# Patient Record
Sex: Female | Born: 1945
Health system: Southern US, Community
[De-identification: ages and names within clinical notes are randomized; demographics above are authoritative.]

## PROBLEM LIST (undated history)

## (undated) DIAGNOSIS — I10 Essential (primary) hypertension: Secondary | ICD-10-CM

## (undated) DIAGNOSIS — I2510 Atherosclerotic heart disease of native coronary artery without angina pectoris: Secondary | ICD-10-CM

## (undated) DIAGNOSIS — E7800 Pure hypercholesterolemia, unspecified: Secondary | ICD-10-CM

## (undated) DIAGNOSIS — F419 Anxiety disorder, unspecified: Secondary | ICD-10-CM

## (undated) DIAGNOSIS — F32A Depression, unspecified: Secondary | ICD-10-CM

## (undated) DIAGNOSIS — T148XXA Other injury of unspecified body region, initial encounter: Secondary | ICD-10-CM

## (undated) DIAGNOSIS — F329 Major depressive disorder, single episode, unspecified: Secondary | ICD-10-CM

## (undated) DIAGNOSIS — C801 Malignant (primary) neoplasm, unspecified: Secondary | ICD-10-CM

## (undated) DIAGNOSIS — I219 Acute myocardial infarction, unspecified: Secondary | ICD-10-CM

## (undated) HISTORY — PX: COLONOSCOPY: SHX174

## (undated) HISTORY — PX: CORONARY ANGIOPLASTY: SHX604

## (undated) HISTORY — PX: EYE SURGERY: SHX253

## (undated) HISTORY — PX: FINGER SURGERY: SHX640

---

## 2004-05-04 ENCOUNTER — Other Ambulatory Visit: Payer: Self-pay

## 2004-05-04 ENCOUNTER — Inpatient Hospital Stay: Payer: Self-pay | Admitting: Cardiology

## 2004-05-05 ENCOUNTER — Other Ambulatory Visit: Payer: Self-pay

## 2004-05-23 ENCOUNTER — Encounter: Payer: Self-pay | Admitting: Cardiology

## 2004-06-08 ENCOUNTER — Encounter: Payer: Self-pay | Admitting: Cardiology

## 2004-07-09 ENCOUNTER — Encounter: Payer: Self-pay | Admitting: Cardiology

## 2004-08-09 ENCOUNTER — Encounter: Payer: Self-pay | Admitting: Cardiology

## 2004-12-21 ENCOUNTER — Emergency Department: Payer: Self-pay | Admitting: Emergency Medicine

## 2005-01-02 ENCOUNTER — Emergency Department: Payer: Self-pay | Admitting: Emergency Medicine

## 2005-05-07 ENCOUNTER — Ambulatory Visit: Payer: Self-pay | Admitting: Unknown Physician Specialty

## 2005-11-26 ENCOUNTER — Ambulatory Visit: Payer: Self-pay | Admitting: Unknown Physician Specialty

## 2006-12-24 ENCOUNTER — Ambulatory Visit: Payer: Self-pay | Admitting: Family Medicine

## 2007-04-03 ENCOUNTER — Ambulatory Visit: Payer: Self-pay | Admitting: Unknown Physician Specialty

## 2008-11-19 ENCOUNTER — Ambulatory Visit: Payer: Self-pay | Admitting: Unknown Physician Specialty

## 2010-01-12 ENCOUNTER — Ambulatory Visit: Payer: Self-pay | Admitting: Unknown Physician Specialty

## 2011-03-15 ENCOUNTER — Ambulatory Visit: Payer: Self-pay | Admitting: Unknown Physician Specialty

## 2012-09-15 ENCOUNTER — Ambulatory Visit: Payer: Self-pay | Admitting: Unknown Physician Specialty

## 2014-10-14 DIAGNOSIS — L720 Epidermal cyst: Secondary | ICD-10-CM | POA: Diagnosis not present

## 2014-10-14 DIAGNOSIS — D481 Neoplasm of uncertain behavior of connective and other soft tissue: Secondary | ICD-10-CM | POA: Diagnosis not present

## 2014-10-14 DIAGNOSIS — D2110 Benign neoplasm of connective and other soft tissue of unspecified upper limb, including shoulder: Secondary | ICD-10-CM | POA: Diagnosis not present

## 2014-10-14 DIAGNOSIS — D2111 Benign neoplasm of connective and other soft tissue of right upper limb, including shoulder: Secondary | ICD-10-CM | POA: Diagnosis not present

## 2014-11-09 DIAGNOSIS — H524 Presbyopia: Secondary | ICD-10-CM | POA: Diagnosis not present

## 2014-11-09 DIAGNOSIS — H5210 Myopia, unspecified eye: Secondary | ICD-10-CM | POA: Diagnosis not present

## 2014-11-19 DIAGNOSIS — H2513 Age-related nuclear cataract, bilateral: Secondary | ICD-10-CM | POA: Diagnosis not present

## 2014-12-09 DIAGNOSIS — L820 Inflamed seborrheic keratosis: Secondary | ICD-10-CM | POA: Diagnosis not present

## 2014-12-09 DIAGNOSIS — D485 Neoplasm of uncertain behavior of skin: Secondary | ICD-10-CM | POA: Diagnosis not present

## 2014-12-09 DIAGNOSIS — L821 Other seborrheic keratosis: Secondary | ICD-10-CM | POA: Diagnosis not present

## 2014-12-09 DIAGNOSIS — C44629 Squamous cell carcinoma of skin of left upper limb, including shoulder: Secondary | ICD-10-CM | POA: Diagnosis not present

## 2014-12-09 DIAGNOSIS — L578 Other skin changes due to chronic exposure to nonionizing radiation: Secondary | ICD-10-CM | POA: Diagnosis not present

## 2014-12-13 ENCOUNTER — Encounter
Admission: RE | Admit: 2014-12-13 | Discharge: 2014-12-13 | Disposition: A | Discharge status: Home / Self Care | Payer: Commercial Managed Care - HMO | Source: Ambulatory Visit | Attending: Ophthalmology | Admitting: Ophthalmology

## 2014-12-13 DIAGNOSIS — I10 Essential (primary) hypertension: Secondary | ICD-10-CM | POA: Diagnosis not present

## 2014-12-13 DIAGNOSIS — H2513 Age-related nuclear cataract, bilateral: Secondary | ICD-10-CM | POA: Diagnosis not present

## 2014-12-13 DIAGNOSIS — Z01812 Encounter for preprocedural laboratory examination: Secondary | ICD-10-CM | POA: Diagnosis not present

## 2014-12-13 DIAGNOSIS — Z01810 Encounter for preprocedural cardiovascular examination: Secondary | ICD-10-CM | POA: Diagnosis not present

## 2014-12-13 DIAGNOSIS — Z79899 Other long term (current) drug therapy: Secondary | ICD-10-CM | POA: Insufficient documentation

## 2014-12-13 LAB — POTASSIUM: POTASSIUM: 3.9 mmol/L (ref 3.5–5.1)

## 2014-12-14 ENCOUNTER — Encounter: Payer: Self-pay | Admitting: *Deleted

## 2014-12-14 DIAGNOSIS — Z85828 Personal history of other malignant neoplasm of skin: Secondary | ICD-10-CM | POA: Diagnosis not present

## 2014-12-14 DIAGNOSIS — Z87891 Personal history of nicotine dependence: Secondary | ICD-10-CM | POA: Diagnosis not present

## 2014-12-14 DIAGNOSIS — Z880 Allergy status to penicillin: Secondary | ICD-10-CM | POA: Diagnosis not present

## 2014-12-14 DIAGNOSIS — I252 Old myocardial infarction: Secondary | ICD-10-CM | POA: Diagnosis not present

## 2014-12-14 DIAGNOSIS — H2511 Age-related nuclear cataract, right eye: Secondary | ICD-10-CM | POA: Diagnosis not present

## 2014-12-14 DIAGNOSIS — I10 Essential (primary) hypertension: Secondary | ICD-10-CM | POA: Diagnosis not present

## 2014-12-14 DIAGNOSIS — F329 Major depressive disorder, single episode, unspecified: Secondary | ICD-10-CM | POA: Diagnosis not present

## 2014-12-14 DIAGNOSIS — Z955 Presence of coronary angioplasty implant and graft: Secondary | ICD-10-CM | POA: Diagnosis not present

## 2014-12-20 ENCOUNTER — Encounter
Admission: RE | Disposition: A | Discharge status: Home / Self Care | Payer: Self-pay | Source: Ambulatory Visit | Attending: Ophthalmology

## 2014-12-20 ENCOUNTER — Ambulatory Visit: Payer: Commercial Managed Care - HMO | Admitting: *Deleted

## 2014-12-20 ENCOUNTER — Ambulatory Visit
Admission: RE | Admit: 2014-12-20 | Discharge: 2014-12-20 | Disposition: A | Discharge status: Home / Self Care | Payer: Commercial Managed Care - HMO | Source: Ambulatory Visit | Attending: Ophthalmology | Admitting: Ophthalmology

## 2014-12-20 ENCOUNTER — Encounter: Payer: Self-pay | Admitting: *Deleted

## 2014-12-20 DIAGNOSIS — I252 Old myocardial infarction: Secondary | ICD-10-CM | POA: Diagnosis not present

## 2014-12-20 DIAGNOSIS — Z955 Presence of coronary angioplasty implant and graft: Secondary | ICD-10-CM | POA: Insufficient documentation

## 2014-12-20 DIAGNOSIS — H2511 Age-related nuclear cataract, right eye: Secondary | ICD-10-CM | POA: Insufficient documentation

## 2014-12-20 DIAGNOSIS — F329 Major depressive disorder, single episode, unspecified: Secondary | ICD-10-CM | POA: Diagnosis not present

## 2014-12-20 DIAGNOSIS — Z87891 Personal history of nicotine dependence: Secondary | ICD-10-CM | POA: Insufficient documentation

## 2014-12-20 DIAGNOSIS — I10 Essential (primary) hypertension: Secondary | ICD-10-CM | POA: Insufficient documentation

## 2014-12-20 DIAGNOSIS — Z85828 Personal history of other malignant neoplasm of skin: Secondary | ICD-10-CM | POA: Insufficient documentation

## 2014-12-20 DIAGNOSIS — Z880 Allergy status to penicillin: Secondary | ICD-10-CM | POA: Insufficient documentation

## 2014-12-20 DIAGNOSIS — H2513 Age-related nuclear cataract, bilateral: Secondary | ICD-10-CM | POA: Diagnosis not present

## 2014-12-20 DIAGNOSIS — I2510 Atherosclerotic heart disease of native coronary artery without angina pectoris: Secondary | ICD-10-CM | POA: Diagnosis not present

## 2014-12-20 DIAGNOSIS — I213 ST elevation (STEMI) myocardial infarction of unspecified site: Secondary | ICD-10-CM | POA: Diagnosis not present

## 2014-12-20 HISTORY — DX: Malignant (primary) neoplasm, unspecified: C80.1

## 2014-12-20 HISTORY — DX: Essential (primary) hypertension: I10

## 2014-12-20 HISTORY — DX: Depression, unspecified: F32.A

## 2014-12-20 HISTORY — PX: CATARACT EXTRACTION W/PHACO: SHX586

## 2014-12-20 HISTORY — DX: Major depressive disorder, single episode, unspecified: F32.9

## 2014-12-20 HISTORY — DX: Acute myocardial infarction, unspecified: I21.9

## 2014-12-20 HISTORY — DX: Atherosclerotic heart disease of native coronary artery without angina pectoris: I25.10

## 2014-12-20 SURGERY — PHACOEMULSIFICATION, CATARACT, WITH IOL INSERTION
Anesthesia: Monitor Anesthesia Care | Site: Eye | Laterality: Right | Wound class: Clean

## 2014-12-20 MED ORDER — SODIUM CHLORIDE 0.9 % IV SOLN
INTRAVENOUS | Status: DC
  Administered 2014-12-20: 10:00:00 via INTRAVENOUS

## 2014-12-20 MED ORDER — NA CHONDROIT SULF-NA HYALURON 40-17 MG/ML IO SOLN
INTRAOCULAR | Status: DC | PRN
Start: 2014-12-20 — End: 2014-12-20
  Administered 2014-12-20: 1 mL via INTRAOCULAR

## 2014-12-20 MED ORDER — PHENYLEPHRINE HCL 10 % OP SOLN
1.0000 [drp] | OPHTHALMIC | Status: AC | PRN
  Administered 2014-12-20 (×4): 1 [drp] via OPHTHALMIC

## 2014-12-20 MED ORDER — CYCLOPENTOLATE HCL 2 % OP SOLN
OPHTHALMIC | Status: AC
  Filled 2014-12-20: qty 2

## 2014-12-20 MED ORDER — NA CHONDROIT SULF-NA HYALURON 40-17 MG/ML IO SOLN
INTRAOCULAR | Status: AC
  Filled 2014-12-20: qty 1

## 2014-12-20 MED ORDER — LIDOCAINE HCL (PF) 1 % IJ SOLN
INTRAOCULAR | Status: DC | PRN
  Administered 2014-12-20: 11:00:00

## 2014-12-20 MED ORDER — MOXIFLOXACIN HCL 0.5 % OP SOLN
OPHTHALMIC | Status: AC
  Administered 2014-12-20: 1 [drp] via OPHTHALMIC
  Filled 2014-12-20: qty 3

## 2014-12-20 MED ORDER — EPINEPHRINE HCL 1 MG/ML IJ SOLN
INTRAMUSCULAR | Status: AC
  Filled 2014-12-20: qty 2

## 2014-12-20 MED ORDER — CYCLOPENTOLATE HCL 2 % OP SOLN
1.0000 [drp] | OPHTHALMIC | Status: AC | PRN
  Administered 2014-12-20: 10:00:00 via OPHTHALMIC
  Administered 2014-12-20 (×3): 1 [drp] via OPHTHALMIC

## 2014-12-20 MED ORDER — LIDOCAINE HCL (PF) 4 % IJ SOLN
INTRAMUSCULAR | Status: AC
  Filled 2014-12-20: qty 10

## 2014-12-20 MED ORDER — TETRACAINE HCL 0.5 % OP SOLN
OPHTHALMIC | Status: AC
  Filled 2014-12-20: qty 2

## 2014-12-20 MED ORDER — BUPIVACAINE HCL (PF) 0.75 % IJ SOLN
INTRAMUSCULAR | Status: AC
  Filled 2014-12-20: qty 10

## 2014-12-20 MED ORDER — ALFENTANIL 500 MCG/ML IJ INJ
INJECTION | INTRAMUSCULAR | Status: DC | PRN
  Administered 2014-12-20: 500 ug via INTRAVENOUS

## 2014-12-20 MED ORDER — PHENYLEPHRINE HCL 10 % OP SOLN
OPHTHALMIC | Status: AC
  Administered 2014-12-20: 1 [drp] via OPHTHALMIC
  Filled 2014-12-20: qty 5

## 2014-12-20 MED ORDER — MOXIFLOXACIN HCL 0.5 % OP SOLN - NO CHARGE
OPHTHALMIC | Status: DC | PRN
  Administered 2014-12-20: 1 [drp]

## 2014-12-20 MED ORDER — CEFUROXIME OPHTHALMIC INJECTION 1 MG/0.1 ML
INJECTION | OPHTHALMIC | Status: AC
  Filled 2014-12-20: qty 0.1

## 2014-12-20 MED ORDER — MIDAZOLAM HCL 2 MG/2ML IJ SOLN
INTRAMUSCULAR | Status: DC | PRN
  Administered 2014-12-20: .5 mg via INTRAVENOUS

## 2014-12-20 MED ORDER — BSS IO SOLN
INTRAOCULAR | Status: DC | PRN
  Administered 2014-12-20: 200 mL

## 2014-12-20 MED ORDER — MOXIFLOXACIN HCL 0.5 % OP SOLN
1.0000 [drp] | OPHTHALMIC | Status: AC | PRN
  Administered 2014-12-20 (×3): 1 [drp] via OPHTHALMIC

## 2014-12-20 MED ORDER — HYALURONIDASE HUMAN 150 UNIT/ML IJ SOLN
INTRAMUSCULAR | Status: AC
  Filled 2014-12-20: qty 1

## 2014-12-20 MED ORDER — LIDOCAINE HCL (PF) 4 % IJ SOLN
INTRAMUSCULAR | Status: DC | PRN
  Administered 2014-12-20: 5 mL

## 2014-12-20 MED ORDER — FENTANYL CITRATE (PF) 100 MCG/2ML IJ SOLN
INTRAMUSCULAR | Status: DC | PRN
  Administered 2014-12-20: 50 ug via INTRAVENOUS

## 2014-12-20 MED ORDER — CARBACHOL 0.01 % IO SOLN
INTRAOCULAR | Status: DC | PRN
  Administered 2014-12-20: 0.5 mL via INTRAOCULAR

## 2014-12-20 MED ORDER — TETRACAINE HCL 0.5 % OP SOLN
OPHTHALMIC | Status: DC | PRN
  Administered 2014-12-20: 1 [drp]

## 2014-12-20 SURGICAL SUPPLY — 27 items
ACTIVE FMS ×3 IMPLANT
CORD BIP STRL DISP 12FT (MISCELLANEOUS) ×3 IMPLANT
CUP MEDICINE 2OZ PLAST GRAD ST (MISCELLANEOUS) ×3 IMPLANT
DRAPE XRAY CASSETTE 23X24 (DRAPES) ×3 IMPLANT
ERASER HMR WETFIELD 18G (MISCELLANEOUS) ×3 IMPLANT
GLOVE BIO SURGEON STRL SZ8 (GLOVE) ×3 IMPLANT
GLOVE SURG LX 6.5 MICRO (GLOVE) ×2
GLOVE SURG LX 8.0 MICRO (GLOVE) ×2
GLOVE SURG LX STRL 6.5 MICRO (GLOVE) ×1 IMPLANT
GLOVE SURG LX STRL 8.0 MICRO (GLOVE) ×1 IMPLANT
GOWN STRL REUS W/ TWL LRG LVL3 (GOWN DISPOSABLE) ×1 IMPLANT
GOWN STRL REUS W/ TWL XL LVL3 (GOWN DISPOSABLE) ×1 IMPLANT
GOWN STRL REUS W/TWL LRG LVL3 (GOWN DISPOSABLE) ×2
GOWN STRL REUS W/TWL XL LVL3 (GOWN DISPOSABLE) ×2
LENS IOL ACRYSERT 20.5 (Intraocular Lens) ×3 IMPLANT
PACK CATARACT (MISCELLANEOUS) ×3 IMPLANT
PACK CATARACT DINGLEDEIN LX (MISCELLANEOUS) ×3 IMPLANT
PACK EYE AFTER SURG (MISCELLANEOUS) ×3 IMPLANT
SHLD EYE VISITEC  UNIV (MISCELLANEOUS) ×3 IMPLANT
SOL PREP PVP 2OZ (MISCELLANEOUS) ×3
SOLUTION PREP PVP 2OZ (MISCELLANEOUS) ×1 IMPLANT
SUT SILK 5-0 (SUTURE) ×3 IMPLANT
SYR 5ML LL (SYRINGE) ×3 IMPLANT
SYR TB 1ML 27GX1/2 LL (SYRINGE) ×6 IMPLANT
WATER STERILE IRR 1000ML POUR (IV SOLUTION) ×3 IMPLANT
WIPE NON LINTING 3.25X3.25 (MISCELLANEOUS) ×3 IMPLANT
acrysert 20.5 ×3 IMPLANT

## 2014-12-20 NOTE — H&P (Signed)
  History and physical was faxed and scanned in.   

## 2014-12-20 NOTE — Anesthesia Preprocedure Evaluation (Signed)
Anesthesia Evaluation  Patient identified by MRN, date of birth, ID band Patient awake    Reviewed: Allergy & Precautions, NPO status , Patient's Chart, lab work & pertinent test results  Airway Mallampati: I  TM Distance: >3 FB Neck ROM: Limited    Dental  (+) Teeth Intact   Pulmonary former smoker,    Pulmonary exam normal       Cardiovascular Exercise Tolerance: Good hypertension, Pt. on medications and Pt. on home beta blockers Rate:Bradycardia  Stents in 2006 and has done weel since.   Neuro/Psych    GI/Hepatic   Endo/Other    Renal/GU      Musculoskeletal   Abdominal Normal abdominal exam  (+)   Peds  Hematology   Anesthesia Other Findings   Reproductive/Obstetrics                             Anesthesia Physical Anesthesia Plan  ASA: III  Anesthesia Plan: MAC   Post-op Pain Management:    Induction:   Airway Management Planned: Nasal Cannula  Additional Equipment:   Intra-op Plan:   Post-operative Plan:   Informed Consent: I have reviewed the patients History and Physical, chart, labs and discussed the procedure including the risks, benefits and alternatives for the proposed anesthesia with the patient or authorized representative who has indicated his/her understanding and acceptance.     Plan Discussed with: CRNA  Anesthesia Plan Comments:         Anesthesia Quick Evaluation

## 2014-12-20 NOTE — Discharge Instructions (Addendum)
See handout Eye Surgery Discharge Instructions  Expect mild scratchy sensation or mild soreness. DO NOT RUB YOUR EYE!  The day of surgery:  Minimal physical activity, but bed rest is not required  No reading, computer work, or close hand work  No bending, lifting, or straining.  May watch TV  For 24 hours:  No driving, legal decisions, or alcoholic beverages  Safety precautions  Eat anything you prefer: It is better to start with liquids, then soup then solid foods.  _____ Eye patch should be worn until postoperative exam tomorrow.  ____ Solar shield eyeglasses should be worn for comfort in the sunlight/patch while sleeping  Resume all regular medications including aspirin or Coumadin if these were discontinued prior to surgery. You may shower, bathe, shave, or wash your hair. Tylenol may be taken for mild discomfort.  Call your doctor if you experience significant pain, nausea, or vomiting, fever > 101 or other signs of infection. [phone] or [phone] Specific instructions:  Follow-up Information    Follow up with Estill Cotta, MD On 12/21/2014.   Specialty:  Ophthalmology   Why:  945am   Contact information:   [place] [address] [phone]     AMBULATORY SURGERY  DISCHARGE INSTRUCTIONS   1) The drugs that you were given will stay in your system until tomorrow so for the next 24 hours you should not:  A) Drive an automobile B) Make any legal decisions C) Drink any alcoholic beverage   2) You may resume regular meals tomorrow.  Today it is better to start with liquids and gradually work up to solid foods.  You may eat anything you prefer, but it is better to start with liquids, then soup and crackers, and gradually work up to solid foods.   3) Please notify your doctor immediately if you have any unusual bleeding, trouble breathing, redness and pain at the surgery site, drainage, fever, or pain not relieved by  medication.         4)

## 2014-12-20 NOTE — Transfer of Care (Signed)
Immediate Anesthesia Transfer of Care Note  Patient: Makayla Burke  Procedure(s) Performed: Procedure(s) with comments: CATARACT EXTRACTION PHACO AND INTRAOCULAR LENS PLACEMENT ([facility]) (Right) - [place] 00:50 AP% 22.2 CDE 21.36  Patient Location: PACU  Anesthesia Type:MAC  Level of Consciousness: awake, alert  and oriented  Airway & Oxygen Therapy: Patient Spontanous Breathing  Post-op Assessment: Report given to RN and Post -op Vital signs reviewed and stable  Post vital signs: Reviewed and stable  Last Vitals:  Filed Vitals:   12/20/14 1151  BP: 152/83  Pulse:   Temp: 35.8 C  Resp: 16    Complications: No apparent anesthesia complications

## 2014-12-20 NOTE — Op Note (Signed)
Date of Surgery: 12/20/2014 Date of Dictation: 12/20/2014 11:48 AM Pre-operative Diagnosis:  Nuclear Sclerotic Cataract right Eye Post-operative Diagnosis: same Procedure performed: Extra-capsular Cataract Extraction (ECCE) with placement of a posterior chamber intraocular lens (IOL) right Eye IOL:  Implant Name Type Inv. Item Serial No. Manufacturer Lot No. LRB No. Used  acrysert 20.5     [id number]     Right 1   Anesthesia: 2% Lidocaine and 4% Marcaine in a 50/50 mixture with 10 unites/ml of Hylenex given as a peribulbar Anesthesiologist: Anesthesiologist: Elyse Hsu, MD CRNA: Bernardo Heater, CRNA Complications: none Estimated Blood Loss: less than 1 ml  Description of procedure:  The patient was given anesthesia and sedation via intravenous access. The patient was then prepped and draped in the usual fashion. A 25-gauge needle was bent for initiating the capsulorhexis. A 5-0 silk suture was placed through the conjunctiva superior and inferiorly to serve as bridle sutures. Hemostasis was obtained at the superior limbus using an eraser cautery. A partial thickness groove was made at the anterior surgical limbus with a 64 Beaver blade and this was dissected anteriorly with an Avaya. The anterior chamber was entered at 10 o'clock with a 1.0 mm paracentesis knife and through the lamellar dissection with a 2.6 mm Alcon keratome. DiscoVisc was injected to replace the aqueous and a continuous tear curvilinear capsulorhexis was performed using a bent 25-gauge needle.  Balance salt on a syringe was used to perform hydro-dissection and phacoemulsification was carried out using a divide and conquer technique. Procedure(s) with comments: CATARACT EXTRACTION PHACO AND INTRAOCULAR LENS PLACEMENT ([facility]) (Right) - [place] 00:50 AP% 22.2 CDE 21.36. Irrigation/aspiration was used to remove the residual cortex and the capsular bag was inflated with DiscoVisc. The intraocular lens was inserted into the  capsular bag using a pre-loaded Acrysert Delivery System. Irrigation/aspiration was used to remove the residual DiscoVisc. The wound was inflated with balanced salt and checked for leaks. None were found. Miostat was injected via the paracentesis track and 0.1 ml of Vigamox containing 1 mg of drug  was injected via the paracentesis track. The wound was checked for leaks again and none were found.   The bridal sutures were removed and two drops of Vigamox were placed on the eye. An eye shield was placed to protect the eye and the patient was discharged to the recovery area in good condition.   Robecca Fulgham MD

## 2014-12-20 NOTE — Interval H&P Note (Signed)
History and Physical Interval Note:  12/20/2014 11:03 AM  [name]  has presented today for surgery, with the diagnosis of CATARACT  The various methods of treatment have been discussed with the patient and family. After consideration of risks, benefits and other options for treatment, the patient has consented to  Procedure(s): CATARACT EXTRACTION PHACO AND INTRAOCULAR LENS PLACEMENT ([place]) (Right) as a surgical intervention .  The patient's history has been reviewed, patient examined, no change in status, stable for surgery.  I have reviewed the patient's chart and labs.  Questions were answered to the patient's satisfaction.     Makayla Burke

## 2014-12-20 NOTE — Anesthesia Postprocedure Evaluation (Signed)
  Anesthesia Post-op Note  Patient: Makayla Burke  Procedure(s) Performed: Procedure(s) with comments: CATARACT EXTRACTION PHACO AND INTRAOCULAR LENS PLACEMENT ([facility]) (Right) - [place] 00:50 AP% 22.2 CDE 21.36  Anesthesia type:MAC  Patient location: PACU  Post pain: Pain level controlled  Post assessment: Post-op Vital signs reviewed, Patient's Cardiovascular Status Stable, Respiratory Function Stable, Patent Airway and No signs of Nausea or vomiting  Post vital signs: Reviewed and stable  Last Vitals:  Filed Vitals:   12/20/14 1151  BP: 152/83  Pulse:   Temp: 35.8 C  Resp: 16    Level of consciousness: awake, alert  and patient cooperative  Complications: No apparent anesthesia complications

## 2015-03-23 DIAGNOSIS — H2512 Age-related nuclear cataract, left eye: Secondary | ICD-10-CM | POA: Diagnosis not present

## 2015-03-28 ENCOUNTER — Ambulatory Visit
Admission: RE | Admit: 2015-03-28 | Discharge: 2015-03-28 | Disposition: A | Discharge status: Home / Self Care | Payer: Commercial Managed Care - HMO | Source: Ambulatory Visit | Attending: Ophthalmology | Admitting: Ophthalmology

## 2015-03-28 ENCOUNTER — Ambulatory Visit: Payer: Commercial Managed Care - HMO | Admitting: Anesthesiology

## 2015-03-28 ENCOUNTER — Encounter
Admission: RE | Disposition: A | Discharge status: Home / Self Care | Payer: Self-pay | Source: Ambulatory Visit | Attending: Ophthalmology

## 2015-03-28 DIAGNOSIS — E780 Pure hypercholesterolemia: Secondary | ICD-10-CM | POA: Insufficient documentation

## 2015-03-28 DIAGNOSIS — Z87891 Personal history of nicotine dependence: Secondary | ICD-10-CM | POA: Insufficient documentation

## 2015-03-28 DIAGNOSIS — Z9889 Other specified postprocedural states: Secondary | ICD-10-CM | POA: Insufficient documentation

## 2015-03-28 DIAGNOSIS — Z79899 Other long term (current) drug therapy: Secondary | ICD-10-CM | POA: Diagnosis not present

## 2015-03-28 DIAGNOSIS — Z9849 Cataract extraction status, unspecified eye: Secondary | ICD-10-CM | POA: Insufficient documentation

## 2015-03-28 DIAGNOSIS — I252 Old myocardial infarction: Secondary | ICD-10-CM | POA: Diagnosis not present

## 2015-03-28 DIAGNOSIS — F329 Major depressive disorder, single episode, unspecified: Secondary | ICD-10-CM | POA: Diagnosis not present

## 2015-03-28 DIAGNOSIS — H2512 Age-related nuclear cataract, left eye: Secondary | ICD-10-CM | POA: Insufficient documentation

## 2015-03-28 DIAGNOSIS — I2510 Atherosclerotic heart disease of native coronary artery without angina pectoris: Secondary | ICD-10-CM | POA: Diagnosis not present

## 2015-03-28 DIAGNOSIS — Z95818 Presence of other cardiac implants and grafts: Secondary | ICD-10-CM | POA: Diagnosis not present

## 2015-03-28 DIAGNOSIS — Z880 Allergy status to penicillin: Secondary | ICD-10-CM | POA: Insufficient documentation

## 2015-03-28 DIAGNOSIS — Z85828 Personal history of other malignant neoplasm of skin: Secondary | ICD-10-CM | POA: Insufficient documentation

## 2015-03-28 DIAGNOSIS — Z7982 Long term (current) use of aspirin: Secondary | ICD-10-CM | POA: Insufficient documentation

## 2015-03-28 DIAGNOSIS — I10 Essential (primary) hypertension: Secondary | ICD-10-CM | POA: Diagnosis not present

## 2015-03-28 HISTORY — PX: CATARACT EXTRACTION W/PHACO: SHX586

## 2015-03-28 HISTORY — DX: Pure hypercholesterolemia, unspecified: E78.00

## 2015-03-28 SURGERY — PHACOEMULSIFICATION, CATARACT, WITH IOL INSERTION
Anesthesia: Monitor Anesthesia Care | Laterality: Left

## 2015-03-28 MED ORDER — LIDOCAINE HCL (PF) 4 % IJ SOLN
INTRAMUSCULAR | Status: DC | PRN
  Administered 2015-03-28: 5 mL via OPHTHALMIC

## 2015-03-28 MED ORDER — EPINEPHRINE HCL 1 MG/ML IJ SOLN
INTRAMUSCULAR | Status: AC
  Filled 2015-03-28: qty 2

## 2015-03-28 MED ORDER — NA CHONDROIT SULF-NA HYALURON 40-17 MG/ML IO SOLN
INTRAOCULAR | Status: DC | PRN
  Administered 2015-03-28: 1 mL via INTRAOCULAR

## 2015-03-28 MED ORDER — MOXIFLOXACIN HCL 0.5 % OP SOLN
1.0000 [drp] | OPHTHALMIC | Status: AC | PRN
  Administered 2015-03-28 (×3): 1 [drp] via OPHTHALMIC

## 2015-03-28 MED ORDER — TETRACAINE HCL 0.5 % OP SOLN
OPHTHALMIC | Status: DC | PRN
  Administered 2015-03-28: 2 [drp] via OPHTHALMIC

## 2015-03-28 MED ORDER — ALFENTANIL 500 MCG/ML IJ INJ
INJECTION | INTRAMUSCULAR | Status: DC | PRN
  Administered 2015-03-28: 500 ug via INTRAVENOUS

## 2015-03-28 MED ORDER — CYCLOPENTOLATE HCL 2 % OP SOLN
1.0000 [drp] | OPHTHALMIC | Status: AC | PRN
  Administered 2015-03-28 (×4): 1 [drp] via OPHTHALMIC

## 2015-03-28 MED ORDER — LIDOCAINE HCL (PF) 4 % IJ SOLN
INTRAOCULAR | Status: DC | PRN
  Administered 2015-03-28: .5 mL via OPHTHALMIC

## 2015-03-28 MED ORDER — MIDAZOLAM HCL 2 MG/2ML IJ SOLN
INTRAMUSCULAR | Status: DC | PRN
  Administered 2015-03-28: 0.5 mg via INTRAVENOUS

## 2015-03-28 MED ORDER — HYALURONIDASE HUMAN 150 UNIT/ML IJ SOLN
INTRAMUSCULAR | Status: AC
  Filled 2015-03-28: qty 1

## 2015-03-28 MED ORDER — SODIUM CHLORIDE 0.9 % IV SOLN
INTRAVENOUS | Status: DC
  Administered 2015-03-28: 09:00:00 via INTRAVENOUS

## 2015-03-28 MED ORDER — LIDOCAINE HCL (PF) 4 % IJ SOLN
INTRAMUSCULAR | Status: AC
  Filled 2015-03-28: qty 5

## 2015-03-28 MED ORDER — TETRACAINE HCL 0.5 % OP SOLN
OPHTHALMIC | Status: AC
  Filled 2015-03-28: qty 2

## 2015-03-28 MED ORDER — MOXIFLOXACIN HCL 0.5 % OP SOLN
OPHTHALMIC | Status: AC
  Administered 2015-03-28: 1 [drp] via OPHTHALMIC
  Filled 2015-03-28: qty 3

## 2015-03-28 MED ORDER — PHENYLEPHRINE HCL 10 % OP SOLN
OPHTHALMIC | Status: AC
  Administered 2015-03-28: 1 [drp] via OPHTHALMIC
  Filled 2015-03-28: qty 5

## 2015-03-28 MED ORDER — CYCLOPENTOLATE HCL 2 % OP SOLN
OPHTHALMIC | Status: AC
  Administered 2015-03-28: 1 [drp] via OPHTHALMIC
  Filled 2015-03-28: qty 2

## 2015-03-28 MED ORDER — EPINEPHRINE HCL 1 MG/ML IJ SOLN
INTRAMUSCULAR | Status: DC | PRN
  Administered 2015-03-28: 250 mL via OPHTHALMIC

## 2015-03-28 MED ORDER — NA CHONDROIT SULF-NA HYALURON 40-17 MG/ML IO SOLN
INTRAOCULAR | Status: AC
Start: 2015-03-28 — End: 2015-03-28
  Filled 2015-03-28: qty 1

## 2015-03-28 MED ORDER — PHENYLEPHRINE HCL 10 % OP SOLN
1.0000 [drp] | OPHTHALMIC | Status: AC | PRN
  Administered 2015-03-28 (×4): 1 [drp] via OPHTHALMIC

## 2015-03-28 MED ORDER — MOXIFLOXACIN HCL 0.5 % OP SOLN
OPHTHALMIC | Status: DC | PRN
  Administered 2015-03-28: 2 [drp]

## 2015-03-28 MED ORDER — BUPIVACAINE HCL (PF) 0.75 % IJ SOLN
INTRAMUSCULAR | Status: AC
  Filled 2015-03-28: qty 10

## 2015-03-28 MED ORDER — CARBACHOL 0.01 % IO SOLN
INTRAOCULAR | Status: DC | PRN
  Administered 2015-03-28: 0.5 mL via INTRAOCULAR

## 2015-03-28 SURGICAL SUPPLY — 31 items
CANNULA ANT/CHMB 27GA (MISCELLANEOUS) ×3 IMPLANT
CORD BIP STRL DISP 12FT (MISCELLANEOUS) ×3 IMPLANT
CUP MEDICINE 2OZ PLAST GRAD ST (MISCELLANEOUS) ×3 IMPLANT
DRAPE XRAY CASSETTE 23X24 (DRAPES) ×3 IMPLANT
ERASER HMR WETFIELD 18G (MISCELLANEOUS) ×3 IMPLANT
GLOVE BIO SURGEON STRL SZ8 (GLOVE) ×3 IMPLANT
GLOVE SURG LX 6.5 MICRO (GLOVE) ×2
GLOVE SURG LX 8.0 MICRO (GLOVE) ×2
GLOVE SURG LX STRL 6.5 MICRO (GLOVE) ×1 IMPLANT
GLOVE SURG LX STRL 8.0 MICRO (GLOVE) ×1 IMPLANT
GOWN STRL REUS W/ TWL LRG LVL3 (GOWN DISPOSABLE) ×1 IMPLANT
GOWN STRL REUS W/ TWL XL LVL3 (GOWN DISPOSABLE) ×1 IMPLANT
GOWN STRL REUS W/TWL LRG LVL3 (GOWN DISPOSABLE) ×2
GOWN STRL REUS W/TWL XL LVL3 (GOWN DISPOSABLE) ×2
LENS IOL ACRYSOF IQ 20.5 (Intraocular Lens) ×3 IMPLANT
PACK CATARACT (MISCELLANEOUS) ×3 IMPLANT
PACK CATARACT DINGLEDEIN LX (MISCELLANEOUS) ×3 IMPLANT
PACK EYE AFTER SURG (MISCELLANEOUS) ×3 IMPLANT
SHLD EYE VISITEC  UNIV (MISCELLANEOUS) ×3 IMPLANT
SOL BAL SALT 15ML (MISCELLANEOUS) ×3
SOL BSS BAG (MISCELLANEOUS) ×3
SOL PREP PVP 2OZ (MISCELLANEOUS) ×3
SOLUTION BAL SALT 15ML (MISCELLANEOUS) ×1 IMPLANT
SOLUTION BSS BAG (MISCELLANEOUS) ×1 IMPLANT
SOLUTION PREP PVP 2OZ (MISCELLANEOUS) ×1 IMPLANT
SUT SILK 5-0 (SUTURE) ×3 IMPLANT
SYR 3ML LL SCALE MARK (SYRINGE) ×3 IMPLANT
SYR 5ML LL (SYRINGE) ×6 IMPLANT
SYR TB 1ML 27GX1/2 LL (SYRINGE) ×3 IMPLANT
WATER STERILE IRR 1000ML POUR (IV SOLUTION) ×3 IMPLANT
WIPE NON LINTING 3.25X3.25 (MISCELLANEOUS) ×3 IMPLANT

## 2015-03-28 NOTE — H&P (Signed)
  See scanned notes. 

## 2015-03-28 NOTE — Interval H&P Note (Signed)
History and Physical Interval Note:  03/28/2015 9:39 AM  Makayla Burke  has presented today for surgery, with the diagnosis of CATARACT  The various methods of treatment have been discussed with the patient and family. After consideration of risks, benefits and other options for treatment, the patient has consented to  Procedure(s): CATARACT EXTRACTION PHACO AND INTRAOCULAR LENS PLACEMENT ([place]) (Left) as a surgical intervention .  The patient's history has been reviewed, patient examined, no change in status, stable for surgery.  I have reviewed the patient's chart and labs.  Questions were answered to the patient's satisfaction.     Hlee Fringer

## 2015-03-28 NOTE — Transfer of Care (Signed)
Immediate Anesthesia Transfer of Care Note  Patient: Makayla Burke  Procedure(s) Performed: Procedure(s) with comments: CATARACT EXTRACTION PHACO AND INTRAOCULAR LENS PLACEMENT ([facility]) (Left) - [place] 0 :43.8 AP 20.7% CDE16.65 casette lot #[id number]  Patient Location: PACU and Short Stay  Anesthesia Type:MAC  Level of Consciousness: awake, alert  and oriented  Airway & Oxygen Therapy: Patient Spontanous Breathing  Post-op Assessment: Report given to RN and Post -op Vital signs reviewed and stable  Post vital signs: Reviewed and stable  Last Vitals:  Filed Vitals:   03/28/15 1021  BP:   Pulse: 48  Temp: 35.8 C  Resp: 14    Complications: No apparent anesthesia complications

## 2015-03-28 NOTE — Op Note (Signed)
Date of Surgery: 03/28/2015 Date of Dictation: 03/28/2015 10:18 AM Pre-operative Diagnosis:  Nuclear Sclerotic Cataract left Eye Post-operative Diagnosis: same Procedure performed: Extra-capsular Cataract Extraction (ECCE) with placement of a posterior chamber intraocular lens (IOL) left Eye IOL:  Implant Name Type Inv. Item Serial No. Manufacturer Lot No. LRB No. Used  LENS IOL ACRYSOF IQ 20.5 - [id number] Intraocular Lens LENS IOL ACRYSOF IQ 20.5 [id number] ALCON   Left 1   Anesthesia: 2% Lidocaine and 4% Marcaine in a 50/50 mixture with 10 unites/ml of Hylenex given as a peribulbar Anesthesiologist: Anesthesiologist: Gunnar Fusi, MD CRNA: Jonna Clark, CRNA Complications: none Estimated Blood Loss: less than 1 ml  Description of procedure:  The patient was given anesthesia and sedation via intravenous access. The patient was then prepped and draped in the usual fashion. A 25-gauge needle was bent for initiating the capsulorhexis. A 5-0 silk suture was placed through the conjunctiva superior and inferiorly to serve as bridle sutures. Hemostasis was obtained at the superior limbus using an eraser cautery. A partial thickness groove was made at the anterior surgical limbus with a 64 Beaver blade and this was dissected anteriorly with an Avaya. The anterior chamber was entered at 10 o'clock with a 1.0 mm paracentesis knife and through the lamellar dissection with a 2.6 mm Alcon keratome. Epi-Shugarcaine 0.5 CC [9 cc BSS Plus (Alcon), 3 cc 4% preservative-free lidocaine (Hospira) and 4 cc 1:1000 preservative-free, bisulfite-free epinephrine] was injected into the anterior chamber via the paracentesis tract. Epi-Shugarcaine 0.5 CC [9 cc BSS Plus (Alcon), 3 cc 4% preservative-free lidocaine (Hospira) and 4 cc 1:1000 preservative-free, bisulfite-free epinephrine] was injected into the anterior chamber via the paracentesis tract. DiscoVisc was injected to replace the aqueous and  a continuous tear curvilinear capsulorhexis was performed using a bent 25-gauge needle.  Balance salt on a syringe was used to perform hydro-dissection and phacoemulsification was carried out using a divide and conquer technique. Procedure(s) with comments: CATARACT EXTRACTION PHACO AND INTRAOCULAR LENS PLACEMENT ([facility]) (Left) - [place] 0 :43.8 AP 20.7% CDE16.65 casette lot #[id number]. Irrigation/aspiration was used to remove the residual cortex and the capsular bag was inflated with DiscoVisc. The intraocular lens was inserted into the capsular bag using a pre-loaded UltraSert Delivery System. Irrigation/aspiration was used to remove the residual DiscoVisc. The wound was inflated with balanced salt and checked for leaks. None were found. Miostat was injected via the paracentesis track and 0.1 ml of Vigamox containing 1 mg of drug  was injected via the paracentesis track. The wound was checked for leaks again and none were found.   The bridal sutures were removed and two drops of Vigamox were placed on the eye. An eye shield was placed to protect the eye and the patient was discharged to the recovery area in good condition.   DINGELDEIN,STEVEN MD

## 2015-03-28 NOTE — Anesthesia Postprocedure Evaluation (Signed)
  Anesthesia Post-op Note  Patient: KYNSLEIGH WESTENDORF  Procedure(s) Performed: Procedure(s) with comments: CATARACT EXTRACTION PHACO AND INTRAOCULAR LENS PLACEMENT ([facility]) (Left) - [place] 0 :43.8 AP 20.7% CDE16.65 casette lot #[id number] H  Anesthesia type:MAC  Patient location: PACU  Post pain: Pain level controlled  Post assessment: Post-op Vital signs reviewed, Patient's Cardiovascular Status Stable, Respiratory Function Stable, Patent Airway and No signs of Nausea or vomiting  Post vital signs: Reviewed and stable  Last Vitals:  Filed Vitals:   03/28/15 1021  BP:   Pulse: 48  Temp: 35.8 C  Resp: 14    Level of consciousness: awake, alert  and patient cooperative  Complications: No apparent anesthesia complications

## 2015-03-28 NOTE — Anesthesia Preprocedure Evaluation (Signed)
Anesthesia Evaluation  Patient identified by MRN, date of birth, ID band Patient awake    Reviewed: Allergy & Precautions, NPO status , Patient's Chart, lab work & pertinent test results  History of Anesthesia Complications Negative for: history of anesthetic complications  Airway Mallampati: II  TM Distance: >3 FB Neck ROM: Full    Dental  (+) Teeth Intact   Pulmonary neg pulmonary ROS, former smoker (quit x 40 yrs ago),           Cardiovascular hypertension, Pt. on medications and Pt. on home beta blockers + CAD, + Past MI and + Cardiac Stents       Neuro/Psych Depression negative neurological ROS     GI/Hepatic negative GI ROS, Neg liver ROS,   Endo/Other  negative endocrine ROS  Renal/GU negative Renal ROS     Musculoskeletal   Abdominal   Peds  Hematology negative hematology ROS (+)   Anesthesia Other Findings   Reproductive/Obstetrics                             Anesthesia Physical Anesthesia Plan  ASA: III  Anesthesia Plan: MAC   Post-op Pain Management:    Induction: Intravenous  Airway Management Planned: Nasal Cannula  Additional Equipment:   Intra-op Plan:   Post-operative Plan:   Informed Consent: I have reviewed the patients History and Physical, chart, labs and discussed the procedure including the risks, benefits and alternatives for the proposed anesthesia with the patient or authorized representative who has indicated his/her understanding and acceptance.     Plan Discussed with:   Anesthesia Plan Comments:         Anesthesia Quick Evaluation

## 2015-03-28 NOTE — Discharge Instructions (Addendum)
See handout. Eye Surgery Discharge Instructions  Expect mild scratchy sensation or mild soreness. DO NOT RUB YOUR EYE!  The day of surgery:  Minimal physical activity, but bed rest is not required  No reading, computer work, or close hand work  No bending, lifting, or straining.  May watch TV  For 24 hours:  No driving, legal decisions, or alcoholic beverages  Safety precautions  Eat anything you prefer: It is better to start with liquids, then soup then solid foods.  _____ Eye patch should be worn until postoperative exam tomorrow.  ____ Solar shield eyeglasses should be worn for comfort in the sunlight/patch while sleeping  Resume all regular medications including aspirin or Coumadin if these were discontinued prior to surgery. You may shower, bathe, shave, or wash your hair. Tylenol may be taken for mild discomfort.  Call your doctor if you experience significant pain, nausea, or vomiting, fever > 101 or other signs of infection. [phone] or [phone] Specific instructions:  Follow-up Information    Follow up with Estill Cotta, MD.   Specialty:  Ophthalmology   Why:  03-29-15 at 11:00   Contact information:   [place] [address] [phone]      Eye Surgery Discharge Instructions  Expect mild scratchy sensation or mild soreness. DO NOT RUB YOUR EYE!  The day of surgery:  Minimal physical activity, but bed rest is not required  No reading, computer work, or close hand work  No bending, lifting, or straining.  May watch TV  For 24 hours:  No driving, legal decisions, or alcoholic beverages  Safety precautions  Eat anything you prefer: It is better to start with liquids, then soup then solid foods.  _____ Eye patch should be worn until postoperative exam tomorrow.  ____ Solar shield eyeglasses should be worn for comfort in the sunlight/patch while sleeping  Resume all regular medications including aspirin or  Coumadin if these were discontinued prior to surgery. You may shower, bathe, shave, or wash your hair. Tylenol may be taken for mild discomfort.  Call your doctor if you experience significant pain, nausea, or vomiting, fever > 101 or other signs of infection. [phone] or [phone] Specific instructions:  Follow-up Information    Follow up with Estill Cotta, MD.   Specialty:  Ophthalmology   Why:  03-29-15 at 11:00   Contact information:   [place] [address] [phone]      Eye Surgery Discharge Instructions  Expect mild scratchy sensation or mild soreness. DO NOT RUB YOUR EYE!  The day of surgery:  Minimal physical activity, but bed rest is not required  No reading, computer work, or close hand work  No bending, lifting, or straining.  May watch TV  For 24 hours:  No driving, legal decisions, or alcoholic beverages  Safety precautions  Eat anything you prefer: It is better to start with liquids, then soup then solid foods.  _____ Eye patch should be worn until postoperative exam tomorrow.  ____ Solar shield eyeglasses should be worn for comfort in the sunlight/patch while sleeping  Resume all regular medications including aspirin or Coumadin if these were discontinued prior to surgery. You may shower, bathe, shave, or wash your hair. Tylenol may be taken for mild discomfort.  Call your doctor if you experience significant pain, nausea, or vomiting, fever > 101 or other signs of infection. [phone] or [phone] Specific instructions:  Follow-up Information    Follow up with Estill Cotta, MD.  Specialty:  Ophthalmology   Why:  03-29-15 at 11:00   Contact information:   [place] [address] [phone]      Eye Surgery Discharge Instructions  Expect mild scratchy sensation or mild soreness. DO NOT RUB YOUR EYE!  The day of surgery:  Minimal physical activity, but bed rest is not  required  No reading, computer work, or close hand work  No bending, lifting, or straining.  May watch TV  For 24 hours:  No driving, legal decisions, or alcoholic beverages  Safety precautions  Eat anything you prefer: It is better to start with liquids, then soup then solid foods.  _____ Eye patch should be worn until postoperative exam tomorrow.  ____ Solar shield eyeglasses should be worn for comfort in the sunlight/patch while sleeping  Resume all regular medications including aspirin or Coumadin if these were discontinued prior to surgery. You may shower, bathe, shave, or wash your hair. Tylenol may be taken for mild discomfort.  Call your doctor if you experience significant pain, nausea, or vomiting, fever > 101 or other signs of infection. [phone] or [phone] Specific instructions:  Follow-up Information    Follow up with Estill Cotta, MD.   Specialty:  Ophthalmology   Why:  03-29-15 at 11:00   Contact information:   [address]   [place] [address] [phone]

## 2015-03-29 ENCOUNTER — Encounter: Payer: Self-pay | Admitting: Ophthalmology

## 2015-04-13 DIAGNOSIS — D179 Benign lipomatous neoplasm, unspecified: Secondary | ICD-10-CM | POA: Diagnosis not present

## 2015-04-13 DIAGNOSIS — Z85828 Personal history of other malignant neoplasm of skin: Secondary | ICD-10-CM | POA: Diagnosis not present

## 2015-04-13 DIAGNOSIS — L820 Inflamed seborrheic keratosis: Secondary | ICD-10-CM | POA: Diagnosis not present

## 2015-06-21 DIAGNOSIS — E782 Mixed hyperlipidemia: Secondary | ICD-10-CM | POA: Diagnosis not present

## 2015-06-21 DIAGNOSIS — I2510 Atherosclerotic heart disease of native coronary artery without angina pectoris: Secondary | ICD-10-CM | POA: Diagnosis not present

## 2015-06-21 DIAGNOSIS — I10 Essential (primary) hypertension: Secondary | ICD-10-CM | POA: Diagnosis not present

## 2015-06-21 DIAGNOSIS — Z9861 Coronary angioplasty status: Secondary | ICD-10-CM | POA: Diagnosis not present

## 2015-12-27 ENCOUNTER — Other Ambulatory Visit: Payer: Self-pay | Admitting: Internal Medicine

## 2015-12-27 DIAGNOSIS — F325 Major depressive disorder, single episode, in full remission: Secondary | ICD-10-CM | POA: Diagnosis not present

## 2015-12-27 DIAGNOSIS — Z1239 Encounter for other screening for malignant neoplasm of breast: Secondary | ICD-10-CM | POA: Diagnosis not present

## 2015-12-27 DIAGNOSIS — Z0000 Encounter for general adult medical examination without abnormal findings: Secondary | ICD-10-CM | POA: Diagnosis not present

## 2015-12-27 DIAGNOSIS — Z124 Encounter for screening for malignant neoplasm of cervix: Secondary | ICD-10-CM | POA: Diagnosis not present

## 2015-12-27 DIAGNOSIS — I10 Essential (primary) hypertension: Secondary | ICD-10-CM | POA: Diagnosis not present

## 2015-12-27 DIAGNOSIS — Z8639 Personal history of other endocrine, nutritional and metabolic disease: Secondary | ICD-10-CM | POA: Diagnosis not present

## 2015-12-27 DIAGNOSIS — Z23 Encounter for immunization: Secondary | ICD-10-CM | POA: Diagnosis not present

## 2016-01-12 DIAGNOSIS — Z0000 Encounter for general adult medical examination without abnormal findings: Secondary | ICD-10-CM | POA: Diagnosis not present

## 2016-01-17 ENCOUNTER — Ambulatory Visit: Payer: Commercial Managed Care - HMO

## 2016-01-30 ENCOUNTER — Ambulatory Visit
Admission: RE | Admit: 2016-01-30 | Discharge: 2016-01-30 | Disposition: A | Discharge status: Home / Self Care | Payer: Commercial Managed Care - HMO | Source: Ambulatory Visit | Attending: Internal Medicine | Admitting: Internal Medicine

## 2016-01-30 ENCOUNTER — Other Ambulatory Visit: Payer: Self-pay | Admitting: Internal Medicine

## 2016-01-30 DIAGNOSIS — Z0000 Encounter for general adult medical examination without abnormal findings: Secondary | ICD-10-CM | POA: Diagnosis not present

## 2016-01-30 DIAGNOSIS — Z1231 Encounter for screening mammogram for malignant neoplasm of breast: Secondary | ICD-10-CM | POA: Insufficient documentation

## 2016-08-06 DIAGNOSIS — Z8349 Family history of other endocrine, nutritional and metabolic diseases: Secondary | ICD-10-CM | POA: Diagnosis not present

## 2016-08-06 DIAGNOSIS — L249 Irritant contact dermatitis, unspecified cause: Secondary | ICD-10-CM | POA: Diagnosis not present

## 2016-08-06 DIAGNOSIS — L718 Other rosacea: Secondary | ICD-10-CM | POA: Diagnosis not present

## 2016-11-30 DIAGNOSIS — R2231 Localized swelling, mass and lump, right upper limb: Secondary | ICD-10-CM | POA: Diagnosis not present

## 2016-11-30 DIAGNOSIS — I10 Essential (primary) hypertension: Secondary | ICD-10-CM | POA: Diagnosis not present

## 2016-11-30 DIAGNOSIS — E782 Mixed hyperlipidemia: Secondary | ICD-10-CM | POA: Diagnosis not present

## 2016-11-30 DIAGNOSIS — F4323 Adjustment disorder with mixed anxiety and depressed mood: Secondary | ICD-10-CM | POA: Diagnosis not present

## 2017-01-03 DIAGNOSIS — D481 Neoplasm of uncertain behavior of connective and other soft tissue: Secondary | ICD-10-CM | POA: Diagnosis not present

## 2017-01-10 DIAGNOSIS — D481 Neoplasm of uncertain behavior of connective and other soft tissue: Secondary | ICD-10-CM | POA: Diagnosis not present

## 2017-01-14 DIAGNOSIS — D481 Neoplasm of uncertain behavior of connective and other soft tissue: Secondary | ICD-10-CM | POA: Diagnosis not present

## 2017-01-29 DIAGNOSIS — R2231 Localized swelling, mass and lump, right upper limb: Secondary | ICD-10-CM | POA: Diagnosis not present

## 2017-01-29 DIAGNOSIS — M25831 Other specified joint disorders, right wrist: Secondary | ICD-10-CM | POA: Diagnosis not present

## 2017-01-30 DIAGNOSIS — I2510 Atherosclerotic heart disease of native coronary artery without angina pectoris: Secondary | ICD-10-CM | POA: Diagnosis not present

## 2017-01-30 DIAGNOSIS — I10 Essential (primary) hypertension: Secondary | ICD-10-CM | POA: Diagnosis not present

## 2017-01-30 DIAGNOSIS — E782 Mixed hyperlipidemia: Secondary | ICD-10-CM | POA: Diagnosis not present

## 2017-01-30 DIAGNOSIS — Z9861 Coronary angioplasty status: Secondary | ICD-10-CM | POA: Diagnosis not present

## 2017-01-30 DIAGNOSIS — Z01818 Encounter for other preprocedural examination: Secondary | ICD-10-CM | POA: Diagnosis not present

## 2017-01-30 DIAGNOSIS — Z01810 Encounter for preprocedural cardiovascular examination: Secondary | ICD-10-CM | POA: Diagnosis not present

## 2017-02-22 DIAGNOSIS — F329 Major depressive disorder, single episode, unspecified: Secondary | ICD-10-CM | POA: Diagnosis not present

## 2017-02-22 DIAGNOSIS — G8918 Other acute postprocedural pain: Secondary | ICD-10-CM | POA: Diagnosis not present

## 2017-02-22 DIAGNOSIS — M67432 Ganglion, left wrist: Secondary | ICD-10-CM | POA: Diagnosis not present

## 2017-02-22 DIAGNOSIS — Z95818 Presence of other cardiac implants and grafts: Secondary | ICD-10-CM | POA: Diagnosis not present

## 2017-02-22 DIAGNOSIS — R2232 Localized swelling, mass and lump, left upper limb: Secondary | ICD-10-CM | POA: Diagnosis not present

## 2017-02-22 DIAGNOSIS — M79645 Pain in left finger(s): Secondary | ICD-10-CM | POA: Diagnosis not present

## 2017-02-22 DIAGNOSIS — D481 Neoplasm of uncertain behavior of connective and other soft tissue: Secondary | ICD-10-CM | POA: Diagnosis not present

## 2017-02-22 DIAGNOSIS — I10 Essential (primary) hypertension: Secondary | ICD-10-CM | POA: Diagnosis not present

## 2017-02-22 DIAGNOSIS — Z7982 Long term (current) use of aspirin: Secondary | ICD-10-CM | POA: Diagnosis not present

## 2017-02-22 DIAGNOSIS — Z951 Presence of aortocoronary bypass graft: Secondary | ICD-10-CM | POA: Diagnosis not present

## 2017-02-22 DIAGNOSIS — M25831 Other specified joint disorders, right wrist: Secondary | ICD-10-CM | POA: Diagnosis not present

## 2017-02-22 DIAGNOSIS — I252 Old myocardial infarction: Secondary | ICD-10-CM | POA: Diagnosis not present

## 2017-02-22 DIAGNOSIS — D480 Neoplasm of uncertain behavior of bone and articular cartilage: Secondary | ICD-10-CM | POA: Diagnosis not present

## 2017-02-22 DIAGNOSIS — E7800 Pure hypercholesterolemia, unspecified: Secondary | ICD-10-CM | POA: Diagnosis not present

## 2017-03-05 DIAGNOSIS — F4323 Adjustment disorder with mixed anxiety and depressed mood: Secondary | ICD-10-CM | POA: Diagnosis not present

## 2017-03-05 DIAGNOSIS — Z23 Encounter for immunization: Secondary | ICD-10-CM | POA: Diagnosis not present

## 2017-03-05 DIAGNOSIS — E782 Mixed hyperlipidemia: Secondary | ICD-10-CM | POA: Diagnosis not present

## 2017-03-05 DIAGNOSIS — I10 Essential (primary) hypertension: Secondary | ICD-10-CM | POA: Diagnosis not present

## 2017-03-05 DIAGNOSIS — Z0000 Encounter for general adult medical examination without abnormal findings: Secondary | ICD-10-CM | POA: Diagnosis not present

## 2017-03-07 DIAGNOSIS — M67431 Ganglion, right wrist: Secondary | ICD-10-CM | POA: Diagnosis not present

## 2017-03-07 DIAGNOSIS — M25641 Stiffness of right hand, not elsewhere classified: Secondary | ICD-10-CM | POA: Diagnosis not present

## 2017-08-08 DIAGNOSIS — Z01810 Encounter for preprocedural cardiovascular examination: Secondary | ICD-10-CM | POA: Diagnosis not present

## 2017-08-08 DIAGNOSIS — I2510 Atherosclerotic heart disease of native coronary artery without angina pectoris: Secondary | ICD-10-CM | POA: Diagnosis not present

## 2017-08-08 DIAGNOSIS — Z9861 Coronary angioplasty status: Secondary | ICD-10-CM | POA: Diagnosis not present

## 2017-08-08 DIAGNOSIS — I10 Essential (primary) hypertension: Secondary | ICD-10-CM | POA: Diagnosis not present

## 2017-08-29 DIAGNOSIS — E782 Mixed hyperlipidemia: Secondary | ICD-10-CM | POA: Diagnosis not present

## 2017-08-29 DIAGNOSIS — I10 Essential (primary) hypertension: Secondary | ICD-10-CM | POA: Diagnosis not present

## 2017-09-05 DIAGNOSIS — Z0000 Encounter for general adult medical examination without abnormal findings: Secondary | ICD-10-CM | POA: Diagnosis not present

## 2017-09-05 DIAGNOSIS — E7800 Pure hypercholesterolemia, unspecified: Secondary | ICD-10-CM | POA: Diagnosis not present

## 2017-09-05 DIAGNOSIS — Z780 Asymptomatic menopausal state: Secondary | ICD-10-CM | POA: Diagnosis not present

## 2017-09-16 DIAGNOSIS — Z780 Asymptomatic menopausal state: Secondary | ICD-10-CM | POA: Diagnosis not present

## 2017-09-16 DIAGNOSIS — M8588 Other specified disorders of bone density and structure, other site: Secondary | ICD-10-CM | POA: Diagnosis not present

## 2017-10-24 DIAGNOSIS — H6983 Other specified disorders of Eustachian tube, bilateral: Secondary | ICD-10-CM | POA: Diagnosis not present

## 2017-10-24 DIAGNOSIS — H6121 Impacted cerumen, right ear: Secondary | ICD-10-CM | POA: Diagnosis not present

## 2017-11-18 DIAGNOSIS — Z1211 Encounter for screening for malignant neoplasm of colon: Secondary | ICD-10-CM | POA: Diagnosis not present

## 2018-01-30 DIAGNOSIS — I10 Essential (primary) hypertension: Secondary | ICD-10-CM | POA: Diagnosis not present

## 2018-01-30 DIAGNOSIS — E782 Mixed hyperlipidemia: Secondary | ICD-10-CM | POA: Diagnosis not present

## 2018-01-30 DIAGNOSIS — I2510 Atherosclerotic heart disease of native coronary artery without angina pectoris: Secondary | ICD-10-CM | POA: Diagnosis not present

## 2018-01-30 DIAGNOSIS — Z9861 Coronary angioplasty status: Secondary | ICD-10-CM | POA: Diagnosis not present

## 2018-02-06 ENCOUNTER — Encounter: Payer: Self-pay | Admitting: *Deleted

## 2018-02-07 ENCOUNTER — Encounter
Admission: RE | Disposition: A | Discharge status: Home / Self Care | Payer: Self-pay | Source: Ambulatory Visit | Attending: Unknown Physician Specialty

## 2018-02-07 ENCOUNTER — Ambulatory Visit: Payer: Medicare HMO | Admitting: Anesthesiology

## 2018-02-07 ENCOUNTER — Ambulatory Visit
Admission: RE | Admit: 2018-02-07 | Discharge: 2018-02-07 | Disposition: A | Discharge status: Home / Self Care | Payer: Medicare HMO | Source: Ambulatory Visit | Attending: Unknown Physician Specialty | Admitting: Unknown Physician Specialty

## 2018-02-07 DIAGNOSIS — D126 Benign neoplasm of colon, unspecified: Secondary | ICD-10-CM | POA: Diagnosis not present

## 2018-02-07 DIAGNOSIS — Z85828 Personal history of other malignant neoplasm of skin: Secondary | ICD-10-CM | POA: Insufficient documentation

## 2018-02-07 DIAGNOSIS — F419 Anxiety disorder, unspecified: Secondary | ICD-10-CM | POA: Diagnosis not present

## 2018-02-07 DIAGNOSIS — I252 Old myocardial infarction: Secondary | ICD-10-CM | POA: Insufficient documentation

## 2018-02-07 DIAGNOSIS — Z87891 Personal history of nicotine dependence: Secondary | ICD-10-CM | POA: Insufficient documentation

## 2018-02-07 DIAGNOSIS — F329 Major depressive disorder, single episode, unspecified: Secondary | ICD-10-CM | POA: Insufficient documentation

## 2018-02-07 DIAGNOSIS — I2510 Atherosclerotic heart disease of native coronary artery without angina pectoris: Secondary | ICD-10-CM | POA: Diagnosis not present

## 2018-02-07 DIAGNOSIS — K635 Polyp of colon: Secondary | ICD-10-CM | POA: Diagnosis not present

## 2018-02-07 DIAGNOSIS — K5790 Diverticulosis of intestine, part unspecified, without perforation or abscess without bleeding: Secondary | ICD-10-CM | POA: Diagnosis not present

## 2018-02-07 DIAGNOSIS — E7800 Pure hypercholesterolemia, unspecified: Secondary | ICD-10-CM | POA: Insufficient documentation

## 2018-02-07 DIAGNOSIS — K640 First degree hemorrhoids: Secondary | ICD-10-CM | POA: Diagnosis not present

## 2018-02-07 DIAGNOSIS — I10 Essential (primary) hypertension: Secondary | ICD-10-CM | POA: Diagnosis not present

## 2018-02-07 DIAGNOSIS — K649 Unspecified hemorrhoids: Secondary | ICD-10-CM | POA: Diagnosis not present

## 2018-02-07 DIAGNOSIS — K5730 Diverticulosis of large intestine without perforation or abscess without bleeding: Secondary | ICD-10-CM | POA: Insufficient documentation

## 2018-02-07 DIAGNOSIS — Z7982 Long term (current) use of aspirin: Secondary | ICD-10-CM | POA: Diagnosis not present

## 2018-02-07 DIAGNOSIS — D124 Benign neoplasm of descending colon: Secondary | ICD-10-CM | POA: Diagnosis not present

## 2018-02-07 DIAGNOSIS — D122 Benign neoplasm of ascending colon: Secondary | ICD-10-CM | POA: Insufficient documentation

## 2018-02-07 DIAGNOSIS — Z1211 Encounter for screening for malignant neoplasm of colon: Secondary | ICD-10-CM | POA: Diagnosis not present

## 2018-02-07 DIAGNOSIS — F418 Other specified anxiety disorders: Secondary | ICD-10-CM | POA: Diagnosis not present

## 2018-02-07 DIAGNOSIS — Z79899 Other long term (current) drug therapy: Secondary | ICD-10-CM | POA: Diagnosis not present

## 2018-02-07 HISTORY — DX: Anxiety disorder, unspecified: F41.9

## 2018-02-07 HISTORY — DX: Other injury of unspecified body region, initial encounter: T14.8XXA

## 2018-02-07 HISTORY — PX: COLONOSCOPY WITH PROPOFOL: SHX5780

## 2018-02-07 SURGERY — COLONOSCOPY WITH PROPOFOL
Anesthesia: General

## 2018-02-07 MED ORDER — LIDOCAINE HCL (PF) 2 % IJ SOLN
INTRAMUSCULAR | Status: AC
  Filled 2018-02-07: qty 10

## 2018-02-07 MED ORDER — LIDOCAINE HCL (CARDIAC) PF 100 MG/5ML IV SOSY
PREFILLED_SYRINGE | INTRAVENOUS | Status: DC | PRN
  Administered 2018-02-07: 50 mg via INTRAVENOUS

## 2018-02-07 MED ORDER — ESMOLOL HCL 100 MG/10ML IV SOLN
INTRAVENOUS | Status: AC
  Filled 2018-02-07: qty 10

## 2018-02-07 MED ORDER — LIDOCAINE HCL (PF) 1 % IJ SOLN
INTRAMUSCULAR | Status: AC
  Administered 2018-02-07: 0.3 mL via INTRADERMAL
  Filled 2018-02-07: qty 2

## 2018-02-07 MED ORDER — PROPOFOL 500 MG/50ML IV EMUL
INTRAVENOUS | Status: DC | PRN
  Administered 2018-02-07: 120 ug/kg/min via INTRAVENOUS

## 2018-02-07 MED ORDER — PROPOFOL 500 MG/50ML IV EMUL
INTRAVENOUS | Status: AC
  Filled 2018-02-07: qty 50

## 2018-02-07 MED ORDER — LIDOCAINE HCL (PF) 1 % IJ SOLN
2.0000 mL | Freq: Once | INTRAMUSCULAR | Status: AC
  Administered 2018-02-07: 0.3 mL via INTRADERMAL

## 2018-02-07 MED ORDER — SODIUM CHLORIDE 0.9 % IV SOLN
INTRAVENOUS | Status: DC
  Administered 2018-02-07: 1000 mL via INTRAVENOUS

## 2018-02-07 MED ORDER — PROPOFOL 10 MG/ML IV BOLUS
INTRAVENOUS | Status: DC | PRN
  Administered 2018-02-07: 60 mg via INTRAVENOUS
  Administered 2018-02-07: 40 mg via INTRAVENOUS

## 2018-02-07 MED ORDER — ESMOLOL HCL 100 MG/10ML IV SOLN
INTRAVENOUS | Status: DC | PRN
  Administered 2018-02-07: 30 mg via INTRAVENOUS

## 2018-02-07 NOTE — Anesthesia Post-op Follow-up Note (Signed)
Anesthesia QCDR form completed.        

## 2018-02-07 NOTE — Anesthesia Postprocedure Evaluation (Signed)
Anesthesia Post Note  Patient: Makayla Burke  Procedure(s) Performed: COLONOSCOPY WITH PROPOFOL (N/A )  Patient location during evaluation: Endoscopy Anesthesia Type: General Level of consciousness: awake and alert Pain management: pain level controlled Vital Signs Assessment: post-procedure vital signs reviewed and stable Respiratory status: spontaneous breathing, nonlabored ventilation, respiratory function stable and patient connected to nasal cannula oxygen Cardiovascular status: blood pressure returned to baseline and stable Postop Assessment: no apparent nausea or vomiting Anesthetic complications: no     Last Vitals:  Vitals:   02/07/18 1402 02/07/18 1412  BP: (!) 183/89 (!) 191/92  Pulse: (!) 47 (!) 48  Resp: 16 13  Temp:    SpO2: 99% 99%    Last Pain:  Vitals:   02/07/18 1412  TempSrc:   PainSc: 0-No pain                 Johney Perotti S

## 2018-02-07 NOTE — Anesthesia Preprocedure Evaluation (Signed)
Anesthesia Evaluation  Patient identified by MRN, date of birth, ID band Patient awake    Reviewed: Allergy & Precautions, NPO status , Patient's Chart, lab work & pertinent test results  History of Anesthesia Complications Negative for: history of anesthetic complications  Airway Mallampati: II       Dental   Pulmonary neg sleep apnea, neg COPD, former smoker,           Cardiovascular hypertension, Pt. on medications + Past MI and + Cardiac Stents  (-) CHF (-) dysrhythmias (-) Valvular Problems/Murmurs     Neuro/Psych neg Seizures Anxiety Depression    GI/Hepatic Neg liver ROS, neg GERD  ,  Endo/Other  neg diabetes  Renal/GU negative Renal ROS     Musculoskeletal   Abdominal   Peds  Hematology   Anesthesia Other Findings   Reproductive/Obstetrics                             Anesthesia Physical Anesthesia Plan  ASA: III  Anesthesia Plan: General   Post-op Pain Management:    Induction: Intravenous  PONV Risk Score and Plan: 3 and Propofol infusion, TIVA and Treatment may vary due to age or medical condition  Airway Management Planned: Nasal Cannula  Additional Equipment:   Intra-op Plan:   Post-operative Plan:   Informed Consent: I have reviewed the patients History and Physical, chart, labs and discussed the procedure including the risks, benefits and alternatives for the proposed anesthesia with the patient or authorized representative who has indicated his/her understanding and acceptance.     Plan Discussed with:   Anesthesia Plan Comments:         Anesthesia Quick Evaluation

## 2018-02-07 NOTE — Op Note (Signed)
[facility] Gastroenterology Patient Name: Makayla Burke Procedure Date: 02/07/2018 1:12 PM MRN: [id number] Account #: [id number] Date of Birth: 31-Mar-1946 Admit Type: Outpatient Age: 72 Room: [facility] ENDO ROOM 1 Gender: Female Note Status: Finalized Procedure:            Colonoscopy Indications:          Screening for colorectal malignant neoplasm Providers:            Manya Silvas, MD Referring MD:         Glendon Axe (Referring MD) Medicines:            Propofol per Anesthesia Complications:        No immediate complications. Procedure:            Pre-Anesthesia Assessment:                       - After reviewing the risks and benefits, the patient                        was deemed in satisfactory condition to undergo the                        procedure.                       After obtaining informed consent, the colonoscope was                        passed under direct vision. Throughout the procedure,                        the patient's blood pressure, pulse, and oxygen                        saturations were monitored continuously. The                        Colonoscope was introduced through the anus and                        advanced to the the cecum, identified by appendiceal                        orifice and ileocecal valve. The colonoscopy was                        somewhat difficult due to significant looping. Findings:      Cecum reached but I forgot to take a picture.      A small polyp was found in the descending colon. The polyp was sessile.       The polyp was removed with a jumbo cold forceps. Resection and retrieval       were complete. Biopsies were taken with a cold forceps for histology.      A few small-mouthed diverticula were found in the sigmoid colon and       descending colon.      Internal hemorrhoids were found during endoscopy. The hemorrhoids were       small, medium-sized and Grade I (internal hemorrhoids that do not     prolapse).      The exam was otherwise without abnormality. Impression:           -  One small polyp in the descending colon, removed with                        a jumbo cold forceps. Resected and retrieved. Biopsied.                       - Diverticulosis in the sigmoid colon and in the                        descending colon.                       - Internal hemorrhoids.                       - The examination was otherwise normal. Recommendation:       - Await pathology results. Manya Silvas, MD 02/07/2018 1:41:27 PM This report has been signed electronically. Number of Addenda: 0 Note Initiated On: 02/07/2018 1:12 PM Scope Withdrawal Time: 0 hours 12 minutes 28 seconds  Total Procedure Duration: 0 hours 19 minutes 44 seconds       [facility]

## 2018-02-07 NOTE — H&P (Signed)
Primary Care Physician:  Glendon Axe, MD Primary Gastroenterologist:  Dr. Vira Agar  Pre-Procedure History & Physical: HPI:  Makayla Burke is a 72 y.o. female is here for an colonoscopy.  For screening exam.   Past Medical History:  Diagnosis Date  . Anxiety   . Cancer ([place])    skin  . Coronary artery disease   . Depression   . Hematoma   . Hypercholesteremia   . Hypertension   . Myocardial infarction [facility])    2006    Past Surgical History:  Procedure Laterality Date  . CATARACT EXTRACTION W/PHACO Right 12/20/2014   Procedure: CATARACT EXTRACTION PHACO AND INTRAOCULAR LENS PLACEMENT ([place]);  Surgeon: Estill Cotta, MD;  Location: [facility] ORS;  Service: Ophthalmology;  Laterality: Right;  [place] 00:50 AP% 22.2 CDE 21.36  . CATARACT EXTRACTION W/PHACO Left 03/28/2015   Procedure: CATARACT EXTRACTION PHACO AND INTRAOCULAR LENS PLACEMENT ([facility]);  Surgeon: Estill Cotta, MD;  Location: [facility] ORS;  Service: Ophthalmology;  Laterality: Left;  [place] 0 :43.8 AP 20.7% CDE16.65 casette lot #[id number]  . COLONOSCOPY    . CORONARY ANGIOPLASTY     stents 2006  . FINGER SURGERY      Prior to Admission medications   Medication Sig Start Date End Date Taking? Authorizing Provider  aspirin EC 81 MG tablet Take 81 mg by mouth daily.    [provider]  hydrochlorothiazide (MICROZIDE) 12.5 MG capsule Take 12.5 mg by mouth daily.    [provider]  metoprolol succinate (TOPROL-XL) 25 MG 24 hr tablet Take 25 mg by mouth daily.    [provider]  PARoxetine (PAXIL) 10 MG tablet Take 10 mg by mouth daily.    [provider]  ramipril (ALTACE) 10 MG capsule Take 10 mg by mouth daily.    [provider]  simvastatin (ZOCOR) 40 MG tablet Take 40 mg by mouth daily.    [provider]    Allergies as of 12/03/2017 - Review Complete 03/28/2015  Allergen Reaction Noted  . Penicillins  12/14/2014    History reviewed. No pertinent family  history.  Social History   Socioeconomic History  . Marital status: Divorced    Spouse name: Not on file  . Number of children: Not on file  . Years of education: Not on file  . Highest education level: Not on file  Occupational History  . Not on file  Social Needs  . Financial resource strain: Not on file  . Food insecurity:    Worry: Not on file    Inability: Not on file  . Transportation needs:    Medical: Not on file    Non-medical: Not on file  Tobacco Use  . Smoking status: Former Research scientist (life sciences)  . Smokeless tobacco: Never Used  Substance and Sexual Activity  . Alcohol use: Yes  . Drug use: Not on file  . Sexual activity: Not on file  Lifestyle  . Physical activity:    Days per week: Not on file    Minutes per session: Not on file  . Stress: Not on file  Relationships  . Social connections:    Talks on phone: Not on file    Gets together: Not on file    Attends religious service: Not on file    Active member of club or organization: Not on file    Attends meetings of clubs or organizations: Not on file    Relationship status: Not on file  . Intimate partner violence:  Fear of current or ex partner: Not on file    Emotionally abused: Not on file    Physically abused: Not on file    Forced sexual activity: Not on file  Other Topics Concern  . Not on file  Social History Narrative  . Not on file    Review of Systems: See HPI, otherwise negative ROS  Physical Exam: BP (!) 184/95   Pulse (!) 49   Temp (!) 96.5 F (35.8 C) (Tympanic)   Resp 17   Ht 5\' 4"  (1.626 m)   Wt 61.2 kg (135 lb)   SpO2 100%   BMI 23.17 kg/m  General:   Alert,  pleasant and cooperative in NAD Head:  Normocephalic and atraumatic. Neck:  Supple; no masses or thyromegaly. Lungs:  Clear throughout to auscultation.    Heart:  Regular rate and rhythm. Abdomen:  Soft, nontender and nondistended. Normal bowel sounds, without guarding, and without rebound.   Neurologic:  Alert and   oriented x4;  grossly normal neurologically.  Impression/Plan: Makayla Burke is here for an colonoscopy to be performed for screening colonoscopy  Risks, benefits, limitations, and alternatives regarding  colonoscopy have been reviewed with the patient.  Questions have been answered.  All parties agreeable.   Gaylyn Cheers, MD  02/07/2018, 1:05 PM

## 2018-02-07 NOTE — Transfer of Care (Signed)
Immediate Anesthesia Transfer of Care Note  Patient: Makayla Burke  Procedure(s) Performed: COLONOSCOPY WITH PROPOFOL (N/A )  Patient Location: PACU  Anesthesia Type:General  Level of Consciousness: sedated  Airway & Oxygen Therapy: Patient Spontanous Breathing and Patient connected to nasal cannula oxygen  Post-op Assessment: Report given to RN and Post -op Vital signs reviewed and stable  Post vital signs: Reviewed and stable  Last Vitals:  Vitals Value Taken Time  BP 116/73 02/07/2018  1:44 PM  Temp    Pulse 43 02/07/2018  1:44 PM  Resp 15 02/07/2018  1:44 PM  SpO2 99 % 02/07/2018  1:44 PM  Vitals shown include unvalidated device data.  Last Pain:  Vitals:   02/07/18 1342  TempSrc: (P) Tympanic  PainSc:          Complications: No apparent anesthesia complications

## 2018-02-07 NOTE — Anesthesia Procedure Notes (Signed)
Performed by: Suzette Flagler, CRNA Pre-anesthesia Checklist: Patient identified, Emergency Drugs available, Suction available, Patient being monitored and Timeout performed Patient Re-evaluated:Patient Re-evaluated prior to induction Oxygen Delivery Method: Nasal cannula Induction Type: IV induction       

## 2018-02-10 ENCOUNTER — Encounter: Payer: Self-pay | Admitting: Unknown Physician Specialty

## 2018-02-10 LAB — SURGICAL PATHOLOGY

## 2018-02-18 ENCOUNTER — Other Ambulatory Visit: Payer: Self-pay | Admitting: Internal Medicine

## 2018-02-18 DIAGNOSIS — Z1231 Encounter for screening mammogram for malignant neoplasm of breast: Secondary | ICD-10-CM

## 2018-02-27 DIAGNOSIS — E7800 Pure hypercholesterolemia, unspecified: Secondary | ICD-10-CM | POA: Diagnosis not present

## 2018-03-05 ENCOUNTER — Encounter: Payer: Self-pay | Admitting: Radiology

## 2018-03-05 ENCOUNTER — Ambulatory Visit
Admission: RE | Admit: 2018-03-05 | Discharge: 2018-03-05 | Disposition: A | Discharge status: Home / Self Care | Payer: Medicare HMO | Source: Ambulatory Visit | Attending: Internal Medicine | Admitting: Internal Medicine

## 2018-03-05 DIAGNOSIS — Z1231 Encounter for screening mammogram for malignant neoplasm of breast: Secondary | ICD-10-CM | POA: Diagnosis not present

## 2018-03-06 DIAGNOSIS — F4323 Adjustment disorder with mixed anxiety and depressed mood: Secondary | ICD-10-CM | POA: Diagnosis not present

## 2018-03-06 DIAGNOSIS — I10 Essential (primary) hypertension: Secondary | ICD-10-CM | POA: Diagnosis not present

## 2018-03-06 DIAGNOSIS — E7800 Pure hypercholesterolemia, unspecified: Secondary | ICD-10-CM | POA: Diagnosis not present

## 2018-07-09 DIAGNOSIS — C4492 Squamous cell carcinoma of skin, unspecified: Secondary | ICD-10-CM

## 2018-07-09 HISTORY — DX: Squamous cell carcinoma of skin, unspecified: C44.92

## 2018-08-04 DIAGNOSIS — C44529 Squamous cell carcinoma of skin of other part of trunk: Secondary | ICD-10-CM | POA: Diagnosis not present

## 2018-08-08 DIAGNOSIS — Z9861 Coronary angioplasty status: Secondary | ICD-10-CM | POA: Diagnosis not present

## 2018-08-08 DIAGNOSIS — I10 Essential (primary) hypertension: Secondary | ICD-10-CM | POA: Diagnosis not present

## 2018-08-08 DIAGNOSIS — I2510 Atherosclerotic heart disease of native coronary artery without angina pectoris: Secondary | ICD-10-CM | POA: Diagnosis not present

## 2018-08-08 DIAGNOSIS — I2102 ST elevation (STEMI) myocardial infarction involving left anterior descending coronary artery: Secondary | ICD-10-CM | POA: Diagnosis not present

## 2018-08-25 DIAGNOSIS — I10 Essential (primary) hypertension: Secondary | ICD-10-CM | POA: Diagnosis not present

## 2018-08-25 DIAGNOSIS — Z9861 Coronary angioplasty status: Secondary | ICD-10-CM | POA: Diagnosis not present

## 2018-08-25 DIAGNOSIS — I2510 Atherosclerotic heart disease of native coronary artery without angina pectoris: Secondary | ICD-10-CM | POA: Diagnosis not present

## 2018-08-25 DIAGNOSIS — I2102 ST elevation (STEMI) myocardial infarction involving left anterior descending coronary artery: Secondary | ICD-10-CM | POA: Diagnosis not present

## 2018-09-04 DIAGNOSIS — I10 Essential (primary) hypertension: Secondary | ICD-10-CM | POA: Diagnosis not present

## 2018-09-10 ENCOUNTER — Other Ambulatory Visit: Payer: Self-pay | Admitting: Internal Medicine

## 2018-09-10 DIAGNOSIS — Z1239 Encounter for other screening for malignant neoplasm of breast: Secondary | ICD-10-CM | POA: Diagnosis not present

## 2018-09-10 DIAGNOSIS — Z1231 Encounter for screening mammogram for malignant neoplasm of breast: Secondary | ICD-10-CM

## 2018-09-10 DIAGNOSIS — Z0000 Encounter for general adult medical examination without abnormal findings: Secondary | ICD-10-CM | POA: Diagnosis not present

## 2018-11-24 DIAGNOSIS — M545 Low back pain: Secondary | ICD-10-CM | POA: Diagnosis not present

## 2018-11-24 DIAGNOSIS — M5442 Lumbago with sciatica, left side: Secondary | ICD-10-CM | POA: Diagnosis not present

## 2018-12-31 DIAGNOSIS — L578 Other skin changes due to chronic exposure to nonionizing radiation: Secondary | ICD-10-CM | POA: Diagnosis not present

## 2018-12-31 DIAGNOSIS — L820 Inflamed seborrheic keratosis: Secondary | ICD-10-CM | POA: Diagnosis not present

## 2018-12-31 DIAGNOSIS — L239 Allergic contact dermatitis, unspecified cause: Secondary | ICD-10-CM | POA: Diagnosis not present

## 2018-12-31 DIAGNOSIS — D692 Other nonthrombocytopenic purpura: Secondary | ICD-10-CM | POA: Diagnosis not present

## 2019-02-04 DIAGNOSIS — Z9861 Coronary angioplasty status: Secondary | ICD-10-CM | POA: Diagnosis not present

## 2019-02-04 DIAGNOSIS — I2510 Atherosclerotic heart disease of native coronary artery without angina pectoris: Secondary | ICD-10-CM | POA: Diagnosis not present

## 2019-04-13 DIAGNOSIS — Z0000 Encounter for general adult medical examination without abnormal findings: Secondary | ICD-10-CM | POA: Diagnosis not present

## 2019-04-13 DIAGNOSIS — Z23 Encounter for immunization: Secondary | ICD-10-CM | POA: Diagnosis not present

## 2019-04-13 DIAGNOSIS — F4323 Adjustment disorder with mixed anxiety and depressed mood: Secondary | ICD-10-CM | POA: Diagnosis not present

## 2019-04-13 DIAGNOSIS — E785 Hyperlipidemia, unspecified: Secondary | ICD-10-CM | POA: Diagnosis not present

## 2019-04-13 DIAGNOSIS — I2510 Atherosclerotic heart disease of native coronary artery without angina pectoris: Secondary | ICD-10-CM | POA: Diagnosis not present

## 2019-04-13 DIAGNOSIS — Z8639 Personal history of other endocrine, nutritional and metabolic disease: Secondary | ICD-10-CM | POA: Diagnosis not present

## 2019-04-13 DIAGNOSIS — Z9861 Coronary angioplasty status: Secondary | ICD-10-CM | POA: Diagnosis not present

## 2019-04-13 DIAGNOSIS — Z87891 Personal history of nicotine dependence: Secondary | ICD-10-CM | POA: Diagnosis not present

## 2019-04-13 DIAGNOSIS — I10 Essential (primary) hypertension: Secondary | ICD-10-CM | POA: Diagnosis not present

## 2019-04-23 ENCOUNTER — Other Ambulatory Visit: Payer: Self-pay | Admitting: Internal Medicine

## 2019-04-23 DIAGNOSIS — Z1231 Encounter for screening mammogram for malignant neoplasm of breast: Secondary | ICD-10-CM

## 2019-07-15 ENCOUNTER — Ambulatory Visit
Admission: RE | Admit: 2019-07-15 | Discharge: 2019-07-15 | Disposition: A | Discharge status: Home / Self Care | Payer: Medicare PPO | Source: Ambulatory Visit | Attending: Internal Medicine | Admitting: Internal Medicine

## 2019-07-15 DIAGNOSIS — Z1231 Encounter for screening mammogram for malignant neoplasm of breast: Secondary | ICD-10-CM | POA: Insufficient documentation

## 2019-08-26 IMAGING — MG MM DIGITAL SCREENING BILAT W/ TOMO W/ CAD
8 series · 8 of 24 positions shown · non-contrast
Comparison: Previous exam(s).

CLINICAL DATA: Screening.

EXAM:
DIGITAL SCREENING BILATERAL MAMMOGRAM WITH TOMO AND CAD

[R CC synth-2D]
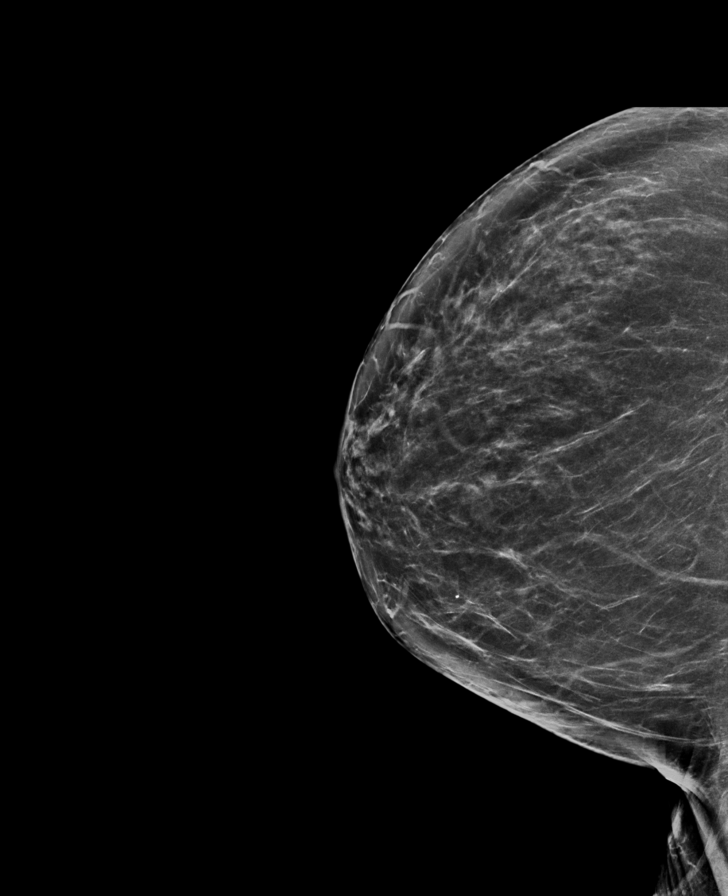

[L MLO synth-2D]
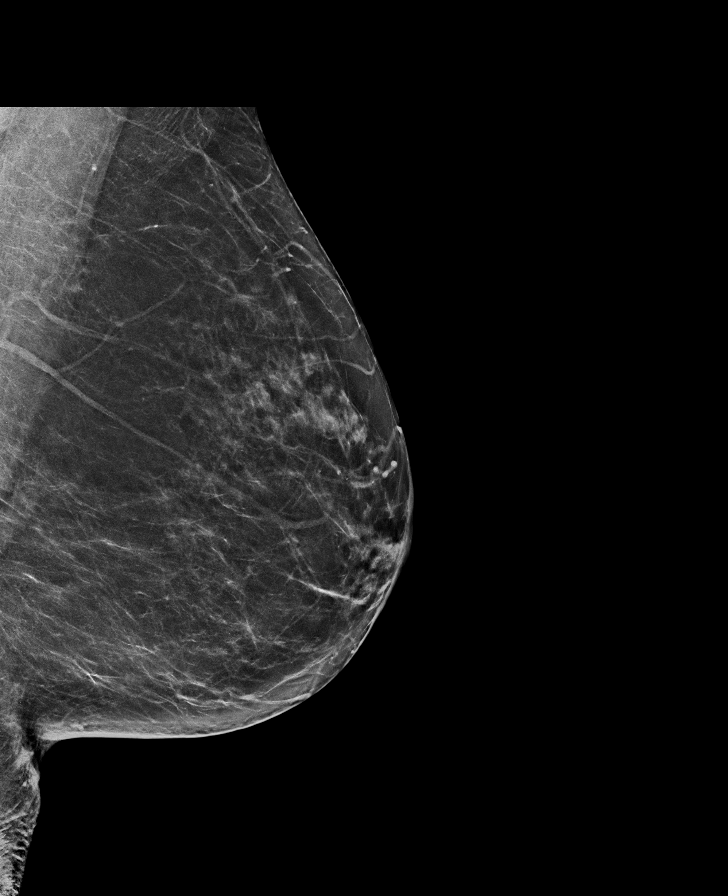

[R MLO synth-2D]
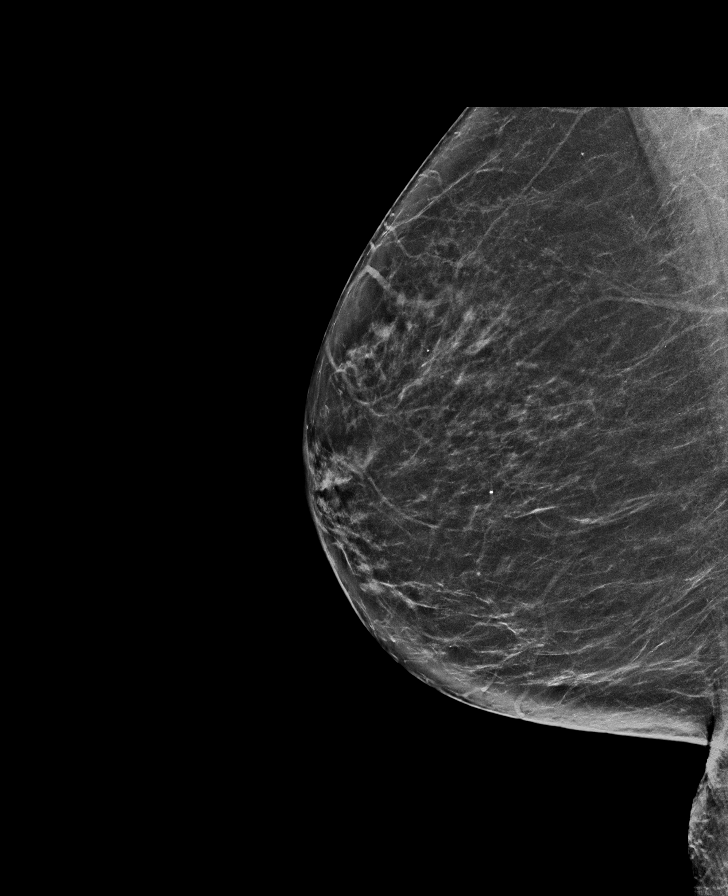

[L CC synth-2D]
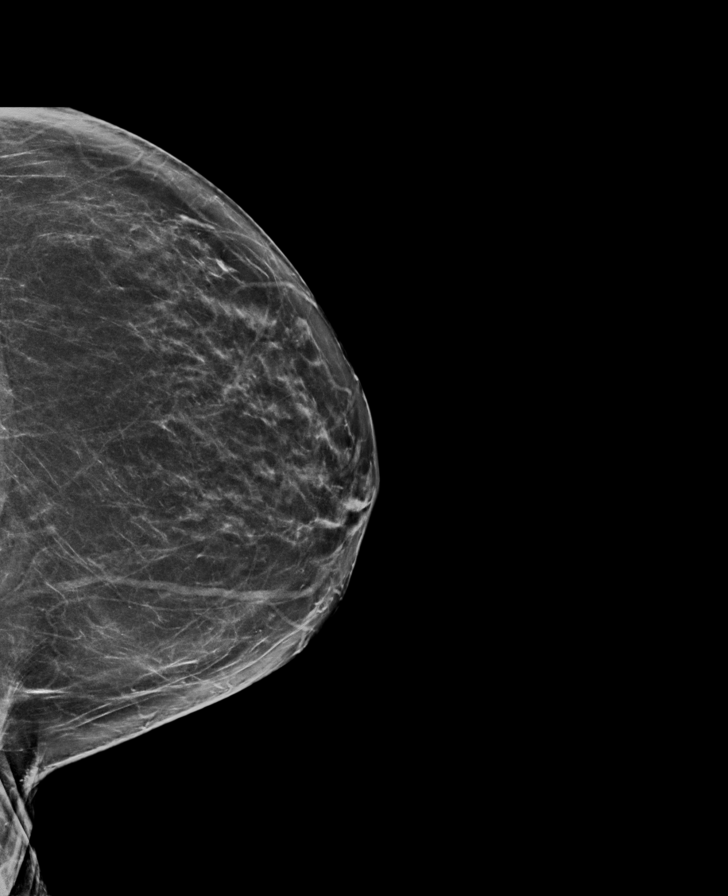

[R MLO tomo · tomo slice 35/69.0]
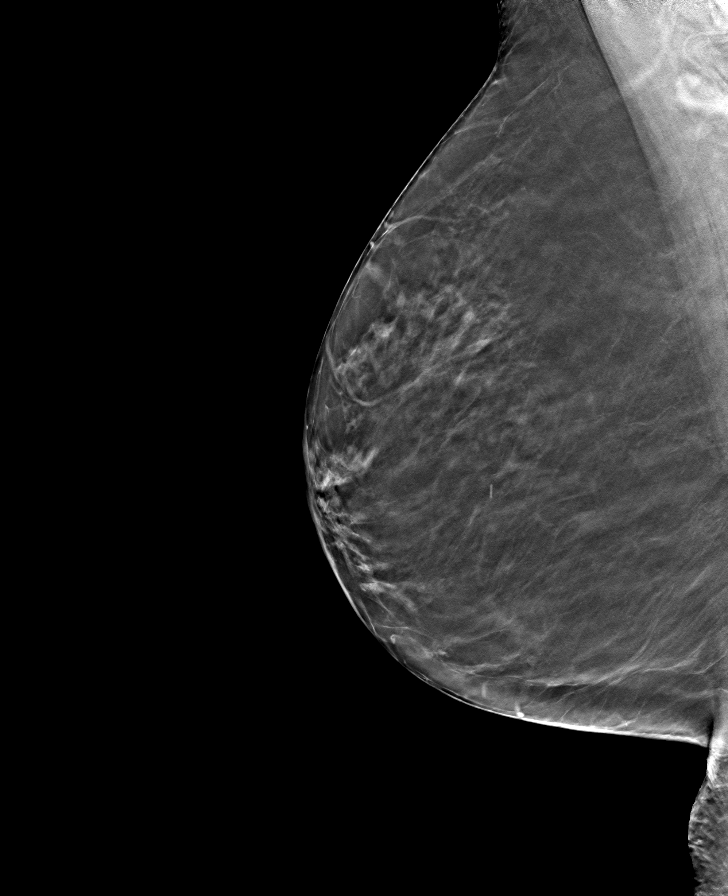

[R CC tomo · tomo slice 37/72.0]
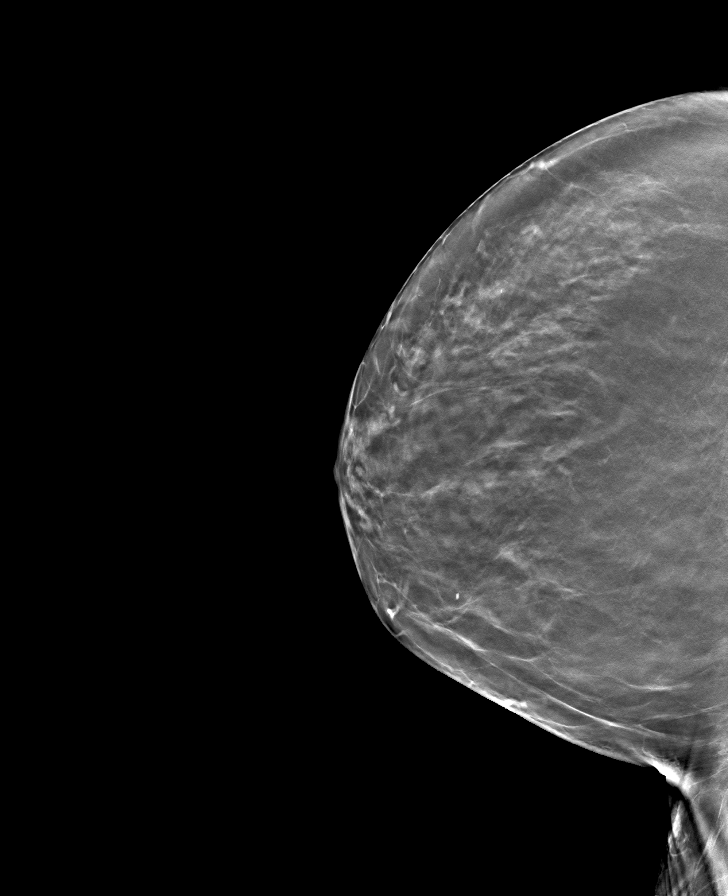

[L CC tomo · tomo slice 35/69.0]
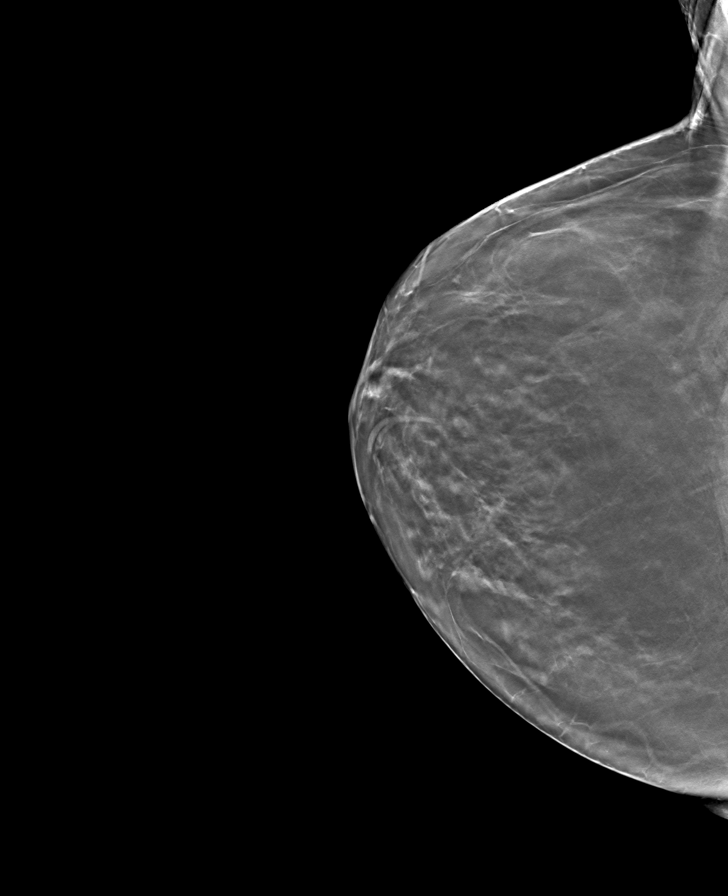

[L MLO tomo · tomo slice 35/69.0]
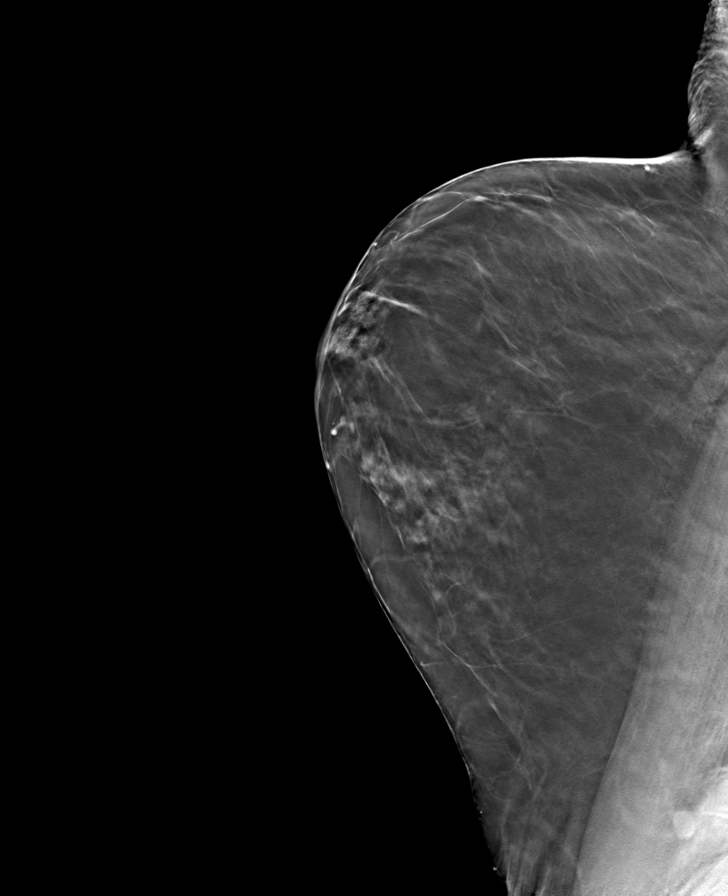

[8 of 24 positions shown; findings below may reference images not displayed]

ACR Breast Density Category b: There are scattered areas of
fibroglandular density.
FINDINGS: There are no findings suspicious for malignancy. Images were
processed with CAD.
IMPRESSION: No mammographic evidence of malignancy. A result letter of this
screening mammogram will be mailed directly to the patient.

RECOMMENDATION:
Screening mammogram in one year. (Code:[id number])

BI-RADS CATEGORY  1: Negative.

## 2020-07-15 DIAGNOSIS — M5431 Sciatica, right side: Secondary | ICD-10-CM | POA: Diagnosis not present

## 2020-07-21 DIAGNOSIS — R001 Bradycardia, unspecified: Secondary | ICD-10-CM | POA: Diagnosis not present

## 2020-07-21 DIAGNOSIS — I2510 Atherosclerotic heart disease of native coronary artery without angina pectoris: Secondary | ICD-10-CM | POA: Diagnosis not present

## 2020-07-21 DIAGNOSIS — E785 Hyperlipidemia, unspecified: Secondary | ICD-10-CM | POA: Diagnosis not present

## 2020-07-21 DIAGNOSIS — I10 Essential (primary) hypertension: Secondary | ICD-10-CM | POA: Diagnosis not present

## 2020-07-21 DIAGNOSIS — Z9861 Coronary angioplasty status: Secondary | ICD-10-CM | POA: Diagnosis not present

## 2020-07-21 DIAGNOSIS — Z23 Encounter for immunization: Secondary | ICD-10-CM | POA: Diagnosis not present

## 2020-08-09 ENCOUNTER — Other Ambulatory Visit: Payer: Self-pay | Admitting: Physical Medicine & Rehabilitation

## 2020-08-09 DIAGNOSIS — M5442 Lumbago with sciatica, left side: Secondary | ICD-10-CM | POA: Diagnosis not present

## 2020-08-09 DIAGNOSIS — M5441 Lumbago with sciatica, right side: Secondary | ICD-10-CM | POA: Diagnosis not present

## 2020-08-09 DIAGNOSIS — G8929 Other chronic pain: Secondary | ICD-10-CM | POA: Diagnosis not present

## 2020-08-17 ENCOUNTER — Ambulatory Visit
Admission: RE | Admit: 2020-08-17 | Discharge: 2020-08-17 | Disposition: A | Discharge status: Home / Self Care | Payer: Medicare HMO | Source: Ambulatory Visit | Attending: Physical Medicine & Rehabilitation | Admitting: Physical Medicine & Rehabilitation

## 2020-08-17 ENCOUNTER — Other Ambulatory Visit: Payer: Self-pay

## 2020-08-17 DIAGNOSIS — M5441 Lumbago with sciatica, right side: Secondary | ICD-10-CM | POA: Diagnosis not present

## 2020-08-17 DIAGNOSIS — M5442 Lumbago with sciatica, left side: Secondary | ICD-10-CM | POA: Diagnosis not present

## 2020-08-17 DIAGNOSIS — M545 Low back pain, unspecified: Secondary | ICD-10-CM | POA: Diagnosis not present

## 2020-08-19 ENCOUNTER — Other Ambulatory Visit: Payer: Self-pay | Admitting: Internal Medicine

## 2020-08-19 DIAGNOSIS — Z1231 Encounter for screening mammogram for malignant neoplasm of breast: Secondary | ICD-10-CM

## 2020-08-30 DIAGNOSIS — M48061 Spinal stenosis, lumbar region without neurogenic claudication: Secondary | ICD-10-CM | POA: Diagnosis not present

## 2020-08-30 DIAGNOSIS — M5442 Lumbago with sciatica, left side: Secondary | ICD-10-CM | POA: Diagnosis not present

## 2020-08-30 DIAGNOSIS — G8929 Other chronic pain: Secondary | ICD-10-CM | POA: Diagnosis not present

## 2020-09-06 ENCOUNTER — Other Ambulatory Visit: Payer: Self-pay

## 2020-09-06 ENCOUNTER — Ambulatory Visit
Admission: RE | Admit: 2020-09-06 | Discharge: 2020-09-06 | Disposition: A | Discharge status: Home / Self Care | Payer: Medicare HMO | Source: Ambulatory Visit | Attending: Internal Medicine | Admitting: Internal Medicine

## 2020-09-06 DIAGNOSIS — Z1231 Encounter for screening mammogram for malignant neoplasm of breast: Secondary | ICD-10-CM | POA: Diagnosis not present

## 2020-09-06 DIAGNOSIS — M5442 Lumbago with sciatica, left side: Secondary | ICD-10-CM | POA: Diagnosis not present

## 2020-09-06 DIAGNOSIS — M5416 Radiculopathy, lumbar region: Secondary | ICD-10-CM | POA: Diagnosis not present

## 2020-09-06 DIAGNOSIS — G8929 Other chronic pain: Secondary | ICD-10-CM | POA: Diagnosis not present

## 2020-09-06 DIAGNOSIS — M48061 Spinal stenosis, lumbar region without neurogenic claudication: Secondary | ICD-10-CM | POA: Diagnosis not present

## 2020-09-20 DIAGNOSIS — M48061 Spinal stenosis, lumbar region without neurogenic claudication: Secondary | ICD-10-CM | POA: Diagnosis not present

## 2020-09-20 DIAGNOSIS — M5442 Lumbago with sciatica, left side: Secondary | ICD-10-CM | POA: Diagnosis not present

## 2020-09-20 DIAGNOSIS — G8929 Other chronic pain: Secondary | ICD-10-CM | POA: Diagnosis not present

## 2020-10-06 DIAGNOSIS — I10 Essential (primary) hypertension: Secondary | ICD-10-CM | POA: Diagnosis not present

## 2020-10-06 DIAGNOSIS — E559 Vitamin D deficiency, unspecified: Secondary | ICD-10-CM | POA: Diagnosis not present

## 2020-10-06 DIAGNOSIS — Z0001 Encounter for general adult medical examination with abnormal findings: Secondary | ICD-10-CM | POA: Diagnosis not present

## 2020-10-06 DIAGNOSIS — E538 Deficiency of other specified B group vitamins: Secondary | ICD-10-CM | POA: Diagnosis not present

## 2020-10-13 DIAGNOSIS — Z0000 Encounter for general adult medical examination without abnormal findings: Secondary | ICD-10-CM | POA: Diagnosis not present

## 2020-10-13 DIAGNOSIS — E785 Hyperlipidemia, unspecified: Secondary | ICD-10-CM | POA: Diagnosis not present

## 2020-10-13 DIAGNOSIS — E559 Vitamin D deficiency, unspecified: Secondary | ICD-10-CM | POA: Diagnosis not present

## 2020-10-13 DIAGNOSIS — Z9861 Coronary angioplasty status: Secondary | ICD-10-CM | POA: Diagnosis not present

## 2020-10-13 DIAGNOSIS — Z0001 Encounter for general adult medical examination with abnormal findings: Secondary | ICD-10-CM | POA: Diagnosis not present

## 2020-10-13 DIAGNOSIS — I10 Essential (primary) hypertension: Secondary | ICD-10-CM | POA: Diagnosis not present

## 2020-10-13 DIAGNOSIS — E538 Deficiency of other specified B group vitamins: Secondary | ICD-10-CM | POA: Diagnosis not present

## 2020-10-13 DIAGNOSIS — F4323 Adjustment disorder with mixed anxiety and depressed mood: Secondary | ICD-10-CM | POA: Diagnosis not present

## 2020-10-20 DIAGNOSIS — E538 Deficiency of other specified B group vitamins: Secondary | ICD-10-CM | POA: Diagnosis not present

## 2020-10-27 DIAGNOSIS — E538 Deficiency of other specified B group vitamins: Secondary | ICD-10-CM | POA: Diagnosis not present

## 2020-11-03 DIAGNOSIS — E538 Deficiency of other specified B group vitamins: Secondary | ICD-10-CM | POA: Diagnosis not present

## 2021-02-06 DIAGNOSIS — Z9861 Coronary angioplasty status: Secondary | ICD-10-CM | POA: Diagnosis not present

## 2021-02-06 DIAGNOSIS — E785 Hyperlipidemia, unspecified: Secondary | ICD-10-CM | POA: Diagnosis not present

## 2021-02-06 DIAGNOSIS — I2510 Atherosclerotic heart disease of native coronary artery without angina pectoris: Secondary | ICD-10-CM | POA: Diagnosis not present

## 2021-02-06 DIAGNOSIS — R001 Bradycardia, unspecified: Secondary | ICD-10-CM | POA: Diagnosis not present

## 2021-02-06 DIAGNOSIS — I10 Essential (primary) hypertension: Secondary | ICD-10-CM | POA: Diagnosis not present

## 2021-02-27 DIAGNOSIS — I2510 Atherosclerotic heart disease of native coronary artery without angina pectoris: Secondary | ICD-10-CM | POA: Diagnosis not present

## 2021-03-06 DIAGNOSIS — I2510 Atherosclerotic heart disease of native coronary artery without angina pectoris: Secondary | ICD-10-CM | POA: Diagnosis not present

## 2021-03-06 DIAGNOSIS — R5383 Other fatigue: Secondary | ICD-10-CM | POA: Diagnosis not present

## 2021-03-06 DIAGNOSIS — R001 Bradycardia, unspecified: Secondary | ICD-10-CM | POA: Diagnosis not present

## 2021-03-06 DIAGNOSIS — I10 Essential (primary) hypertension: Secondary | ICD-10-CM | POA: Diagnosis not present

## 2021-03-06 DIAGNOSIS — Z9861 Coronary angioplasty status: Secondary | ICD-10-CM | POA: Diagnosis not present

## 2021-03-21 DIAGNOSIS — U071 COVID-19: Secondary | ICD-10-CM | POA: Diagnosis not present

## 2021-04-07 DIAGNOSIS — E785 Hyperlipidemia, unspecified: Secondary | ICD-10-CM | POA: Diagnosis not present

## 2021-04-07 DIAGNOSIS — I10 Essential (primary) hypertension: Secondary | ICD-10-CM | POA: Diagnosis not present

## 2021-04-07 DIAGNOSIS — I2510 Atherosclerotic heart disease of native coronary artery without angina pectoris: Secondary | ICD-10-CM | POA: Diagnosis not present

## 2021-04-07 DIAGNOSIS — E538 Deficiency of other specified B group vitamins: Secondary | ICD-10-CM | POA: Diagnosis not present

## 2021-04-07 DIAGNOSIS — E559 Vitamin D deficiency, unspecified: Secondary | ICD-10-CM | POA: Diagnosis not present

## 2021-04-14 DIAGNOSIS — I10 Essential (primary) hypertension: Secondary | ICD-10-CM | POA: Diagnosis not present

## 2021-04-14 DIAGNOSIS — F4323 Adjustment disorder with mixed anxiety and depressed mood: Secondary | ICD-10-CM | POA: Diagnosis not present

## 2021-04-14 DIAGNOSIS — Z23 Encounter for immunization: Secondary | ICD-10-CM | POA: Diagnosis not present

## 2021-04-14 DIAGNOSIS — E785 Hyperlipidemia, unspecified: Secondary | ICD-10-CM | POA: Diagnosis not present

## 2021-04-14 DIAGNOSIS — Z9861 Coronary angioplasty status: Secondary | ICD-10-CM | POA: Diagnosis not present

## 2021-04-14 DIAGNOSIS — I2510 Atherosclerotic heart disease of native coronary artery without angina pectoris: Secondary | ICD-10-CM | POA: Diagnosis not present

## 2021-05-04 DIAGNOSIS — J40 Bronchitis, not specified as acute or chronic: Secondary | ICD-10-CM | POA: Diagnosis not present

## 2021-05-04 DIAGNOSIS — R058 Other specified cough: Secondary | ICD-10-CM | POA: Diagnosis not present

## 2021-06-26 DIAGNOSIS — H524 Presbyopia: Secondary | ICD-10-CM | POA: Diagnosis not present

## 2021-06-26 DIAGNOSIS — H26492 Other secondary cataract, left eye: Secondary | ICD-10-CM | POA: Diagnosis not present

## 2021-06-29 DIAGNOSIS — H26492 Other secondary cataract, left eye: Secondary | ICD-10-CM | POA: Diagnosis not present

## 2021-09-13 DIAGNOSIS — J301 Allergic rhinitis due to pollen: Secondary | ICD-10-CM | POA: Diagnosis not present

## 2021-09-13 DIAGNOSIS — H6121 Impacted cerumen, right ear: Secondary | ICD-10-CM | POA: Diagnosis not present

## 2021-09-13 DIAGNOSIS — H6983 Other specified disorders of Eustachian tube, bilateral: Secondary | ICD-10-CM | POA: Diagnosis not present

## 2021-10-02 ENCOUNTER — Ambulatory Visit: Payer: Medicare HMO | Admitting: Dermatology

## 2021-10-02 ENCOUNTER — Other Ambulatory Visit: Payer: Self-pay

## 2021-10-02 ENCOUNTER — Encounter: Payer: Self-pay | Admitting: Dermatology

## 2021-10-02 DIAGNOSIS — C44519 Basal cell carcinoma of skin of other part of trunk: Secondary | ICD-10-CM

## 2021-10-02 DIAGNOSIS — Z1283 Encounter for screening for malignant neoplasm of skin: Secondary | ICD-10-CM

## 2021-10-02 DIAGNOSIS — L578 Other skin changes due to chronic exposure to nonionizing radiation: Secondary | ICD-10-CM | POA: Diagnosis not present

## 2021-10-02 DIAGNOSIS — L570 Actinic keratosis: Secondary | ICD-10-CM

## 2021-10-02 DIAGNOSIS — C4491 Basal cell carcinoma of skin, unspecified: Secondary | ICD-10-CM

## 2021-10-02 DIAGNOSIS — D485 Neoplasm of uncertain behavior of skin: Secondary | ICD-10-CM

## 2021-10-02 DIAGNOSIS — C44722 Squamous cell carcinoma of skin of right lower limb, including hip: Secondary | ICD-10-CM | POA: Diagnosis not present

## 2021-10-02 DIAGNOSIS — L820 Inflamed seborrheic keratosis: Secondary | ICD-10-CM

## 2021-10-02 DIAGNOSIS — D229 Melanocytic nevi, unspecified: Secondary | ICD-10-CM

## 2021-10-02 HISTORY — DX: Basal cell carcinoma of skin, unspecified: C44.91

## 2021-10-02 NOTE — Patient Instructions (Signed)
Cryotherapy Aftercare  Wash gently with soap and water everyday.   Apply Vaseline and Band-Aid daily until healed.    Wound Care Instructions  Cleanse wound gently with soap and water once a day then pat dry with clean gauze. Apply a thing coat of Petrolatum (petroleum jelly, "Vaseline") over the wound (unless you have an allergy to this). We recommend that you use a new, sterile tube of Vaseline. Do not pick or remove scabs. Do not remove the yellow or white "healing tissue" from the base of the wound.  Cover the wound with fresh, clean, nonstick gauze and secure with paper tape. You may use Band-Aids in place of gauze and tape if the would is small enough, but would recommend trimming much of the tape off as there is often too much. Sometimes Band-Aids can irritate the skin.  You should call the office for your biopsy report after 1 week if you have not already been contacted.  If you experience any problems, such as abnormal amounts of bleeding, swelling, significant bruising, significant pain, or evidence of infection, please call the office immediately.  FOR ADULT SURGERY PATIENTS: If you need something for pain relief you may take 1 extra strength Tylenol (acetaminophen) AND 2 Ibuprofen (200mg each) together every 4 hours as needed for pain. (do not take these if you are allergic to them or if you have a reason you should not take them.) Typically, you may only need pain medication for 1 to 3 days.        If You Need Anything After Your Visit  If you have any questions or concerns for your doctor, please call our main line at [phone] and press option 4 to reach your doctor's medical assistant. If no one answers, please leave a voicemail as directed and we will return your call as soon as possible. Messages left after 4 pm will be answered the following business day.   You may also send us a message via MyChart. We typically respond to MyChart messages within 1-2 business  days.  For prescription refills, please ask your pharmacy to contact our office. Our fax number is [phone].  If you have an urgent issue when the clinic is closed that cannot wait until the next business day, you can page your doctor at the number below.    Please note that while we do our best to be available for urgent issues outside of office hours, we are not available 24/7.   If you have an urgent issue and are unable to reach us, you may choose to seek medical care at your doctor's office, retail clinic, urgent care center, or emergency room.  If you have a medical emergency, please immediately call 911 or go to the emergency department.  Pager Numbers  - Dr. Kowalski: [phone]  - Dr. Moye: [phone]  - Dr. Stewart: [phone]  In the event of inclement weather, please call our main line at [phone] for an update on the status of any delays or closures.  Dermatology Medication Tips: Please keep the boxes that topical medications come in in order to help keep track of the instructions about where and how to use these. Pharmacies typically print the medication instructions only on the boxes and not directly on the medication tubes.   If your medication is too expensive, please contact our office at [phone] option 4 or send us a message through MyChart.   We are unable to tell what your co-pay for medications will be   in advance as this is different depending on your insurance coverage. However, we may be able to find a substitute medication at lower cost or fill out paperwork to get insurance to cover a needed medication.   If a prior authorization is required to get your medication covered by your insurance company, please allow us 1-2 business days to complete this process.  Drug prices often vary depending on where the prescription is filled and some pharmacies may offer cheaper prices.  The website www.goodrx.com contains coupons for medications through  different pharmacies. The prices here do not account for what the cost may be with help from insurance (it may be cheaper with your insurance), but the website can give you the price if you did not use any insurance.  - You can print the associated coupon and take it with your prescription to the pharmacy.  - You may also stop by our office during regular business hours and pick up a GoodRx coupon card.  - If you need your prescription sent electronically to a different pharmacy, notify our office through [facility] MyChart or by phone at [phone] option 4.     Si Usted Necesita Algo Despus de Su Visita  Tambin puede enviarnos un mensaje a travs de MyChart. Por lo general respondemos a los mensajes de MyChart en el transcurso de 1 a 2 das hbiles.  Para renovar recetas, por favor pida a su farmacia que se ponga en contacto con nuestra oficina. Nuestro nmero de fax es el [phone].  Si tiene un asunto urgente cuando la clnica est cerrada y que no puede esperar hasta el siguiente da hbil, puede llamar/localizar a su doctor(a) al nmero que aparece a continuacin.   Por favor, tenga en cuenta que aunque hacemos todo lo posible para estar disponibles para asuntos urgentes fuera del horario de oficina, no estamos disponibles las 24 horas del da, los 7 das de la semana.   Si tiene un problema urgente y no puede comunicarse con nosotros, puede optar por buscar atencin mdica  en el consultorio de su doctor(a), en una clnica privada, en un centro de atencin urgente o en una sala de emergencias.  Si tiene una emergencia mdica, por favor llame inmediatamente al 911 o vaya a la sala de emergencias.  Nmeros de bper  - Dr. Kowalski: [phone]  - Dra. Moye: [phone]  - Dra. Stewart: [phone]  En caso de inclemencias del tiempo, por favor llame a nuestra lnea principal al [phone] para una actualizacin sobre el estado de cualquier retraso o cierre.  Consejos  para la medicacin en dermatologa: Por favor, guarde las cajas en las que vienen los medicamentos de uso tpico para ayudarle a seguir las instrucciones sobre dnde y cmo usarlos. Las farmacias generalmente imprimen las instrucciones del medicamento slo en las cajas y no directamente en los tubos del medicamento.   Si su medicamento es muy caro, por favor, pngase en contacto con nuestra oficina llamando al [phone] y presione la opcin 4 o envenos un mensaje a travs de MyChart.   No podemos decirle cul ser su copago por los medicamentos por adelantado ya que esto es diferente dependiendo de la cobertura de su seguro. Sin embargo, es posible que podamos encontrar un medicamento sustituto a menor costo o llenar un formulario para que el seguro cubra el medicamento que se considera necesario.   Si se requiere una autorizacin previa para que su compaa de seguros cubra su medicamento, por favor permtanos de 1 a   2 das hbiles para completar este proceso.  Los precios de los medicamentos varan con frecuencia dependiendo del lugar de dnde se surte la receta y alguna farmacias pueden ofrecer precios ms baratos.  El sitio web www.goodrx.com tiene cupones para medicamentos de diferentes farmacias. Los precios aqu no tienen en cuenta lo que podra costar con la ayuda del seguro (puede ser ms barato con su seguro), pero el sitio web puede darle el precio si no utiliz ningn seguro.  - Puede imprimir el cupn correspondiente y llevarlo con su receta a la farmacia.  - Tambin puede pasar por nuestra oficina durante el horario de atencin regular y recoger una tarjeta de cupones de GoodRx.  - Si necesita que su receta se enve electrnicamente a una farmacia diferente, informe a nuestra oficina a travs de MyChart de  o por telfono llamando al [phone] y presione la opcin 4.  

## 2021-10-02 NOTE — Progress Notes (Signed)
? ?New Patient Visit ? ?Subjective  ?Makayla Burke is a 76 y.o. female who presents for the following: Other (Spots of face, right calf and back). ?The patient presents for Upper Body Skin Exam (UBSE) for skin cancer screening and mole check.  The patient has spots, moles and lesions to be evaluated, some may be new or changing and the patient has concerns that these could be cancer.  ? ?The following portions of the chart were reviewed this encounter and updated as appropriate:  ? Tobacco  Allergies  Meds  Problems  Med Hx  Surg Hx  Fam Hx   ?  ?Review of Systems:  No other skin or systemic complaints except as noted in HPI or Assessment and Plan. ? ?Objective  ?Well appearing patient in no apparent distress; mood and affect are within normal limits. ? ?All skin waist up examined. ? ?Right mid post calf ?1.1 cm hyperkeratotic papule ? ?Left Upper Back Paraspinal ?0.8 cm hyperkeratotic papule ? ?Bilateral dorsum hands x 2, left ear x 1, right forehead x 1 (4) ?Erythematous thin papules/macules with gritty scale.  ? ?Left inf nasolabial x 1, left temple x 1, left sideburn x 1 (3) ?Erythematous stuck-on, waxy papule or plaque ? ? ?Assessment & Plan  ?Neoplasm of uncertain behavior of skin (2) ?Right mid post calf ? ?Epidermal / dermal shaving ? ?Lesion diameter (cm):  1.1 ?Informed consent: discussed and consent obtained   ?Timeout: patient name, date of birth, surgical site, and procedure verified   ?Procedure prep:  Patient was prepped and draped in usual sterile fashion ?Prep type:  Isopropyl alcohol ?Anesthesia: the lesion was anesthetized in a standard fashion   ?Anesthetic:  1% lidocaine w/ epinephrine 1-100,000 buffered w/ 8.4% NaHCO3 ?Instrument used: flexible razor blade   ?Hemostasis achieved with: pressure, aluminum chloride and electrodesiccation   ?Outcome: patient tolerated procedure well   ?Post-procedure details: sterile dressing applied and wound care instructions given   ?Dressing type:  bandage and petrolatum   ? ?Destruction of lesion ?Complexity: extensive   ?Destruction method: electrodesiccation and curettage   ?Informed consent: discussed and consent obtained   ?Timeout:  patient name, date of birth, surgical site, and procedure verified ?Procedure prep:  Patient was prepped and draped in usual sterile fashion ?Prep type:  Isopropyl alcohol ?Anesthesia: the lesion was anesthetized in a standard fashion   ?Anesthetic:  1% lidocaine w/ epinephrine 1-100,000 buffered w/ 8.4% NaHCO3 ?Curettage performed in three different directions: Yes   ?Electrodesiccation performed over the curetted area: Yes   ?Lesion length (cm):  1.1 ?Lesion width (cm):  1.1 ?Margin per side (cm):  0.2 ?Final wound size (cm):  1.5 ?Hemostasis achieved with:  pressure and aluminum chloride ?Outcome: patient tolerated procedure well with no complications   ?Post-procedure details: sterile dressing applied and wound care instructions given   ?Dressing type: bandage and petrolatum   ? ?Specimen 1 - Surgical pathology ?Differential Diagnosis: SCC vs other  ?Check Margins: No ? ?Left Upper Back Paraspinal ? ?Epidermal / dermal shaving ? ?Lesion diameter (cm):  0.8 ?Informed consent: discussed and consent obtained   ?Timeout: patient name, date of birth, surgical site, and procedure verified   ?Procedure prep:  Patient was prepped and draped in usual sterile fashion ?Prep type:  Isopropyl alcohol ?Anesthesia: the lesion was anesthetized in a standard fashion   ?Anesthetic:  1% lidocaine w/ epinephrine 1-100,000 buffered w/ 8.4% NaHCO3 ?Instrument used: flexible razor blade   ?Hemostasis achieved with: pressure, aluminum chloride and  electrodesiccation   ?Outcome: patient tolerated procedure well   ?Post-procedure details: sterile dressing applied and wound care instructions given   ?Dressing type: bandage and petrolatum   ? ?Destruction of lesion ?Complexity: extensive   ?Destruction method: electrodesiccation and curettage    ?Informed consent: discussed and consent obtained   ?Timeout:  patient name, date of birth, surgical site, and procedure verified ?Procedure prep:  Patient was prepped and draped in usual sterile fashion ?Prep type:  Isopropyl alcohol ?Anesthesia: the lesion was anesthetized in a standard fashion   ?Anesthetic:  1% lidocaine w/ epinephrine 1-100,000 buffered w/ 8.4% NaHCO3 ?Curettage performed in three different directions: Yes   ?Electrodesiccation performed over the curetted area: Yes   ?Lesion length (cm):  0.8 ?Lesion width (cm):  0.8 ?Margin per side (cm):  0.2 ?Final wound size (cm):  1.2 ?Hemostasis achieved with:  pressure and aluminum chloride ?Outcome: patient tolerated procedure well with no complications   ?Post-procedure details: sterile dressing applied and wound care instructions given   ?Dressing type: bandage and petrolatum   ? ?Specimen 2 - Surgical pathology ?Differential Diagnosis: SCC vs other  ?Check Margins: No ? ?AK (actinic keratosis) (4) ?Bilateral dorsum hands x 2, left ear x 1, right forehead x 1 ? ?Destruction of lesion - Bilateral dorsum hands x 2, left ear x 1, right forehead x 1 ?Complexity: simple   ?Destruction method: cryotherapy   ?Informed consent: discussed and consent obtained   ?Timeout:  patient name, date of birth, surgical site, and procedure verified ?Lesion destroyed using liquid nitrogen: Yes   ?Region frozen until ice ball extended beyond lesion: Yes   ?Outcome: patient tolerated procedure well with no complications   ?Post-procedure details: wound care instructions given   ? ?Inflamed seborrheic keratosis (3) ?Left inf nasolabial x 1, left temple x 1, left sideburn x 1 ? ?Destruction of lesion - Left inf nasolabial x 1, left temple x 1, left sideburn x 1 ?Complexity: simple   ?Destruction method: cryotherapy   ?Informed consent: discussed and consent obtained   ?Timeout:  patient name, date of birth, surgical site, and procedure verified ?Lesion destroyed using liquid  nitrogen: Yes   ?Region frozen until ice ball extended beyond lesion: Yes   ?Outcome: patient tolerated procedure well with no complications   ?Post-procedure details: wound care instructions given   ? ?Skin cancer screening ? ?Actinic Damage ?- chronic, secondary to cumulative UV radiation exposure/sun exposure over time ?- diffuse scaly erythematous macules with underlying dyspigmentation ?- Recommend daily broad spectrum sunscreen SPF 30+ to sun-exposed areas, reapply every 2 hours as needed.  ?- Recommend staying in the shade or wearing long sleeves, sun glasses (UVA+UVB protection) and wide brim hats (4-inch brim around the entire circumference of the hat). ?- Call for new or changing lesions. ? ?Melanocytic Nevi ?- Tan-brown and/or pink-flesh-colored symmetric macules and papules ?- Benign appearing on exam today ?- Observation ?- Call clinic for new or changing moles ?- Recommend daily use of broad spectrum spf 30+ sunscreen to sun-exposed areas.  ? ?Return in about 6 months (around 04/04/2022) for AK follow up, Biopsy Follow up. ? ?I, Ashok Cordia, CMA, am acting as scribe for Sarina Ser, MD . ?Documentation: I have reviewed the above documentation for accuracy and completeness, and I agree with the above. ? ?Sarina Ser, MD ? ? ?

## 2021-10-03 ENCOUNTER — Encounter: Payer: Self-pay | Admitting: Dermatology

## 2021-10-03 ENCOUNTER — Telehealth: Payer: Self-pay

## 2021-10-03 NOTE — Telephone Encounter (Signed)
-----   Message from Ralene Bathe, MD sent at 10/03/2021  5:43 PM EDT ----- ?Diagnosis ?1. Skin , right mid post calf ?ATYPICAL SQUAMOUS PROLIFERATION, EVOLVING SQUAMOUS CELL CARCINOMA NOT EXCLUDED, SEE ?DESCRIPTION ?2. Skin , left upper back paraspinal ?BASAL CELL CARCINOMA, NODULAR PATTERN, CLOSE TO MARGIN ? ?1-cancer -SCC ?Already treated ?Recheck next visit ?2-cancer-BCC ?Already treated ?Check next visit ?

## 2021-10-03 NOTE — Telephone Encounter (Signed)
Advised patient of results/hd  

## 2021-10-09 DIAGNOSIS — E785 Hyperlipidemia, unspecified: Secondary | ICD-10-CM | POA: Diagnosis not present

## 2021-10-16 DIAGNOSIS — I10 Essential (primary) hypertension: Secondary | ICD-10-CM | POA: Diagnosis not present

## 2021-10-16 DIAGNOSIS — F4323 Adjustment disorder with mixed anxiety and depressed mood: Secondary | ICD-10-CM | POA: Diagnosis not present

## 2021-10-16 DIAGNOSIS — Z0000 Encounter for general adult medical examination without abnormal findings: Secondary | ICD-10-CM | POA: Diagnosis not present

## 2021-10-16 DIAGNOSIS — Z0001 Encounter for general adult medical examination with abnormal findings: Secondary | ICD-10-CM | POA: Diagnosis not present

## 2021-10-16 DIAGNOSIS — E785 Hyperlipidemia, unspecified: Secondary | ICD-10-CM | POA: Diagnosis not present

## 2021-10-16 DIAGNOSIS — I2510 Atherosclerotic heart disease of native coronary artery without angina pectoris: Secondary | ICD-10-CM | POA: Diagnosis not present

## 2021-10-16 DIAGNOSIS — Z1389 Encounter for screening for other disorder: Secondary | ICD-10-CM | POA: Diagnosis not present

## 2021-10-16 DIAGNOSIS — Z9861 Coronary angioplasty status: Secondary | ICD-10-CM | POA: Diagnosis not present

## 2021-10-26 ENCOUNTER — Other Ambulatory Visit: Payer: Self-pay | Admitting: Internal Medicine

## 2021-10-26 DIAGNOSIS — Z1231 Encounter for screening mammogram for malignant neoplasm of breast: Secondary | ICD-10-CM

## 2021-11-27 ENCOUNTER — Ambulatory Visit
Admission: RE | Admit: 2021-11-27 | Discharge: 2021-11-27 | Disposition: A | Discharge status: Home / Self Care | Payer: Medicare HMO | Source: Ambulatory Visit | Attending: Internal Medicine | Admitting: Internal Medicine

## 2021-11-27 DIAGNOSIS — Z1231 Encounter for screening mammogram for malignant neoplasm of breast: Secondary | ICD-10-CM | POA: Insufficient documentation

## 2022-04-04 ENCOUNTER — Ambulatory Visit: Payer: Medicare HMO | Admitting: Dermatology

## 2022-04-04 DIAGNOSIS — L820 Inflamed seborrheic keratosis: Secondary | ICD-10-CM | POA: Diagnosis not present

## 2022-04-04 DIAGNOSIS — B078 Other viral warts: Secondary | ICD-10-CM | POA: Diagnosis not present

## 2022-04-04 DIAGNOSIS — Z85828 Personal history of other malignant neoplasm of skin: Secondary | ICD-10-CM

## 2022-04-04 DIAGNOSIS — L578 Other skin changes due to chronic exposure to nonionizing radiation: Secondary | ICD-10-CM | POA: Diagnosis not present

## 2022-04-04 NOTE — Patient Instructions (Addendum)
Cryotherapy Aftercare  Wash gently with soap and water everyday.   Apply Vaseline and Band-Aid daily until healed.     Due to recent changes in healthcare laws, you may see results of your pathology and/or laboratory studies on MyChart before the doctors have had a chance to review them. We understand that in some cases there may be results that are confusing or concerning to you. Please understand that not all results are received at the same time and often the doctors may need to interpret multiple results in order to provide you with the best plan of care or course of treatment. Therefore, we ask that you please give us 2 business days to thoroughly review all your results before contacting the office for clarification. Should we see a critical lab result, you will be contacted sooner.   If You Need Anything After Your Visit  If you have any questions or concerns for your doctor, please call our main line at [phone] and press option 4 to reach your doctor's medical assistant. If no one answers, please leave a voicemail as directed and we will return your call as soon as possible. Messages left after 4 pm will be answered the following business day.   You may also send us a message via MyChart. We typically respond to MyChart messages within 1-2 business days.  For prescription refills, please ask your pharmacy to contact our office. Our fax number is [phone].  If you have an urgent issue when the clinic is closed that cannot wait until the next business day, you can page your doctor at the number below.    Please note that while we do our best to be available for urgent issues outside of office hours, we are not available 24/7.   If you have an urgent issue and are unable to reach us, you may choose to seek medical care at your doctor's office, retail clinic, urgent care center, or emergency room.  If you have a medical emergency, please immediately call 911 or go to the  emergency department.  Pager Numbers  - Dr. Kowalski: [phone]  - Dr. Moye: [phone]  - Dr. Stewart: [phone]  In the event of inclement weather, please call our main line at [phone] for an update on the status of any delays or closures.  Dermatology Medication Tips: Please keep the boxes that topical medications come in in order to help keep track of the instructions about where and how to use these. Pharmacies typically print the medication instructions only on the boxes and not directly on the medication tubes.   If your medication is too expensive, please contact our office at [phone] option 4 or send us a message through MyChart.   We are unable to tell what your co-pay for medications will be in advance as this is different depending on your insurance coverage. However, we may be able to find a substitute medication at lower cost or fill out paperwork to get insurance to cover a needed medication.   If a prior authorization is required to get your medication covered by your insurance company, please allow us 1-2 business days to complete this process.  Drug prices often vary depending on where the prescription is filled and some pharmacies may offer cheaper prices.  The website www.goodrx.com contains coupons for medications through different pharmacies. The prices here do not account for what the cost may be with help from insurance (it may be cheaper with your insurance), but the website can   give you the price if you did not use any insurance.  - You can print the associated coupon and take it with your prescription to the pharmacy.  - You may also stop by our office during regular business hours and pick up a GoodRx coupon card.  - If you need your prescription sent electronically to a different pharmacy, notify our office through [facility] MyChart or by phone at [phone] option 4.     Si Usted Necesita Algo Despus de Su Visita  Tambin puede  enviarnos un mensaje a travs de MyChart. Por lo general respondemos a los mensajes de MyChart en el transcurso de 1 a 2 das hbiles.  Para renovar recetas, por favor pida a su farmacia que se ponga en contacto con nuestra oficina. Nuestro nmero de fax es el [phone].  Si tiene un asunto urgente cuando la clnica est cerrada y que no puede esperar hasta el siguiente da hbil, puede llamar/localizar a su doctor(a) al nmero que aparece a continuacin.   Por favor, tenga en cuenta que aunque hacemos todo lo posible para estar disponibles para asuntos urgentes fuera del horario de oficina, no estamos disponibles las 24 horas del da, los 7 das de la semana.   Si tiene un problema urgente y no puede comunicarse con nosotros, puede optar por buscar atencin mdica  en el consultorio de su doctor(a), en una clnica privada, en un centro de atencin urgente o en una sala de emergencias.  Si tiene una emergencia mdica, por favor llame inmediatamente al 911 o vaya a la sala de emergencias.  Nmeros de bper  - Dr. Kowalski: [phone]  - Dra. Moye: [phone]  - Dra. Stewart: [phone]  En caso de inclemencias del tiempo, por favor llame a nuestra lnea principal al [phone] para una actualizacin sobre el estado de cualquier retraso o cierre.  Consejos para la medicacin en dermatologa: Por favor, guarde las cajas en las que vienen los medicamentos de uso tpico para ayudarle a seguir las instrucciones sobre dnde y cmo usarlos. Las farmacias generalmente imprimen las instrucciones del medicamento slo en las cajas y no directamente en los tubos del medicamento.   Si su medicamento es muy caro, por favor, pngase en contacto con nuestra oficina llamando al [phone] y presione la opcin 4 o envenos un mensaje a travs de MyChart.   No podemos decirle cul ser su copago por los medicamentos por adelantado ya que esto es diferente dependiendo de la cobertura de su seguro.  Sin embargo, es posible que podamos encontrar un medicamento sustituto a menor costo o llenar un formulario para que el seguro cubra el medicamento que se considera necesario.   Si se requiere una autorizacin previa para que su compaa de seguros cubra su medicamento, por favor permtanos de 1 a 2 das hbiles para completar este proceso.  Los precios de los medicamentos varan con frecuencia dependiendo del lugar de dnde se surte la receta y alguna farmacias pueden ofrecer precios ms baratos.  El sitio web www.goodrx.com tiene cupones para medicamentos de diferentes farmacias. Los precios aqu no tienen en cuenta lo que podra costar con la ayuda del seguro (puede ser ms barato con su seguro), pero el sitio web puede darle el precio si no utiliz ningn seguro.  - Puede imprimir el cupn correspondiente y llevarlo con su receta a la farmacia.  - Tambin puede pasar por nuestra oficina durante el horario de atencin regular y recoger una tarjeta de cupones de GoodRx.  -   Si necesita que su receta se enve electrnicamente a una farmacia diferente, informe a nuestra oficina a travs de MyChart de [facility] o por telfono llamando al [phone] y presione la opcin 4.  

## 2022-04-04 NOTE — Progress Notes (Unsigned)
   Follow-Up Visit   Subjective  Makayla Burke is a 76 y.o. female who presents for the following: Hx of SCC (right mid post calf, 64mf/u), Hx of BCc (left upper back paraspinal, 632m/u), Actinic Keratosis (Face, ear, hands, 74m18mu), and Other (Spots of left hand that needs to be checked ).  The following portions of the chart were reviewed this encounter and updated as appropriate:   Tobacco  Allergies  Meds  Problems  Med Hx  Surg Hx  Fam Hx     Review of Systems:  No other skin or systemic complaints except as noted in HPI or Assessment and Plan.  Objective  Well appearing patient in no apparent distress; mood and affect are within normal limits.  A focused examination was performed including face, ears, hands, R lower leg, back. Relevant physical exam findings are noted in the Assessment and Plan.  Left Palmar Middle 5th Finger Verrucous papules -- Discussed viral etiology and contagion.   Left Dorsal Hand Erythematous stuck-on, waxy papule or plaque   Assessment & Plan   History of Squamous Cell Carcinoma of the Skin - No evidence of recurrence today - No lymphadenopathy - Recommend regular full body skin exams - Recommend daily broad spectrum sunscreen SPF 30+ to sun-exposed areas, reapply every 2 hours as needed.  - Call if any new or changing lesions are noted between office visits - Right mid post calf  History of Basal Cell Carcinoma of the Skin - No evidence of recurrence today - Recommend regular full body skin exams - Recommend daily broad spectrum sunscreen SPF 30+ to sun-exposed areas, reapply every 2 hours as needed.  - Call if any new or changing lesions are noted between office visits  - Left upper back paraspinal  Actinic Damage - chronic, secondary to cumulative UV radiation exposure/sun exposure over time - diffuse scaly erythematous macules with underlying dyspigmentation - Recommend daily broad spectrum sunscreen SPF 30+ to sun-exposed areas,  reapply every 2 hours as needed.  - Recommend staying in the shade or wearing long sleeves, sun glasses (UVA+UVB protection) and wide brim hats (4-inch brim around the entire circumference of the hat). - Call for new or changing lesions.    Other viral warts Left Palmar Middle 5th Finger  Destruction of lesion - Left Palmar Middle 5th Finger Complexity: simple   Destruction method: cryotherapy   Informed consent: discussed and consent obtained   Timeout:  patient name, date of birth, surgical site, and procedure verified Lesion destroyed using liquid nitrogen: Yes   Region frozen until ice ball extended beyond lesion: Yes   Outcome: patient tolerated procedure well with no complications   Post-procedure details: wound care instructions given    Inflamed seborrheic keratosis Left Dorsal Hand  Destruction of lesion - Left Dorsal Hand Complexity: simple   Destruction method: cryotherapy   Informed consent: discussed and consent obtained   Timeout:  patient name, date of birth, surgical site, and procedure verified Lesion destroyed using liquid nitrogen: Yes   Region frozen until ice ball extended beyond lesion: Yes   Outcome: patient tolerated procedure well with no complications   Post-procedure details: wound care instructions given     Return in about 1 year (around 04/05/2023).  I, HolAshok CordiaMA, am acting as scribe for DavSarina SerD . Documentation: I have reviewed the above documentation for accuracy and completeness, and I agree with the above.  DavSarina SerD

## 2022-04-05 ENCOUNTER — Encounter: Payer: Self-pay | Admitting: Dermatology

## 2022-04-10 DIAGNOSIS — I10 Essential (primary) hypertension: Secondary | ICD-10-CM | POA: Diagnosis not present

## 2022-04-17 DIAGNOSIS — Z23 Encounter for immunization: Secondary | ICD-10-CM | POA: Diagnosis not present

## 2022-04-17 DIAGNOSIS — I2510 Atherosclerotic heart disease of native coronary artery without angina pectoris: Secondary | ICD-10-CM | POA: Diagnosis not present

## 2022-04-17 DIAGNOSIS — E785 Hyperlipidemia, unspecified: Secondary | ICD-10-CM | POA: Diagnosis not present

## 2022-04-17 DIAGNOSIS — F4323 Adjustment disorder with mixed anxiety and depressed mood: Secondary | ICD-10-CM | POA: Diagnosis not present

## 2022-04-17 DIAGNOSIS — I10 Essential (primary) hypertension: Secondary | ICD-10-CM | POA: Diagnosis not present

## 2022-04-17 DIAGNOSIS — Z9861 Coronary angioplasty status: Secondary | ICD-10-CM | POA: Diagnosis not present

## 2022-08-27 ENCOUNTER — Encounter: Payer: Self-pay | Admitting: Dermatology

## 2022-08-27 ENCOUNTER — Other Ambulatory Visit: Payer: Self-pay | Admitting: Dermatology

## 2022-08-27 ENCOUNTER — Ambulatory Visit: Payer: Medicare HMO | Admitting: Dermatology

## 2022-08-27 VITALS — BP 127/76 | HR 65

## 2022-08-27 DIAGNOSIS — L578 Other skin changes due to chronic exposure to nonionizing radiation: Secondary | ICD-10-CM

## 2022-08-27 DIAGNOSIS — C44729 Squamous cell carcinoma of skin of left lower limb, including hip: Secondary | ICD-10-CM | POA: Diagnosis not present

## 2022-08-27 DIAGNOSIS — D489 Neoplasm of uncertain behavior, unspecified: Secondary | ICD-10-CM

## 2022-08-27 NOTE — Progress Notes (Signed)
   Follow-Up Visit   Subjective  Makayla Burke is a 77 y.o. female who presents for the following: OTHER (Patient reports a itchy growth on lower left leg that bothers her. She is not sure how long spot has been there. ).The patient has spots, moles and lesions to be evaluated, some may be new or changing and the patient has concerns that these could be cancer.  The following portions of the chart were reviewed this encounter and updated as appropriate:  Tobacco  Allergies  Meds  Problems  Med Hx  Surg Hx  Fam Hx     Review of Systems: No other skin or systemic complaints except as noted in HPI or Assessment and Plan.  Objective  Well appearing patient in no apparent distress; mood and affect are within normal limits.  A focused examination was performed including left lower leg. Relevant physical exam findings are noted in the Assessment and Plan.  left lateral calf 2.1 cm red crusted nodule         Assessment & Plan  Neoplasm of uncertain behavior left lateral calf Epidermal / dermal shaving  Lesion diameter (cm):  2.1 Informed consent: discussed and consent obtained   Timeout: patient name, date of birth, surgical site, and procedure verified   Procedure prep:  Patient was prepped and draped in usual sterile fashion Prep type:  Isopropyl alcohol Anesthesia: the lesion was anesthetized in a standard fashion   Anesthetic:  1% lidocaine w/ epinephrine 1-100,000 buffered w/ 8.4% NaHCO3 Instrument used: flexible razor blade   Hemostasis achieved with: pressure, aluminum chloride and electrodesiccation   Outcome: patient tolerated procedure well   Post-procedure details: sterile dressing applied and wound care instructions given   Dressing type: bandage and petrolatum    Destruction of lesion Complexity: extensive   Destruction method: electrodesiccation and curettage   Informed consent: discussed and consent obtained   Timeout:  patient name, date of birth,  surgical site, and procedure verified Procedure prep:  Patient was prepped and draped in usual sterile fashion Prep type:  Isopropyl alcohol Anesthesia: the lesion was anesthetized in a standard fashion   Anesthetic:  1% lidocaine w/ epinephrine 1-100,000 buffered w/ 8.4% NaHCO3 Curettage performed in three different directions: Yes   Electrodesiccation performed over the curetted area: Yes   Lesion length (cm):  2.1 Lesion width (cm):  2.1 Margin per side (cm):  0.2 Final wound size (cm):  2.5 Hemostasis achieved with:  pressure, aluminum chloride and electrodesiccation Outcome: patient tolerated procedure well with no complications   Post-procedure details: sterile dressing applied and wound care instructions given   Dressing type: bandage and petrolatum    Specimen 1 - Surgical pathology Differential Diagnosis: r/o scc Check Margins: No R/o scc   Actinic Damage - chronic, secondary to cumulative UV radiation exposure/sun exposure over time - diffuse scaly erythematous macules with underlying dyspigmentation - Recommend daily broad spectrum sunscreen SPF 30+ to sun-exposed areas, reapply every 2 hours as needed.  - Recommend staying in the shade or wearing long sleeves, sun glasses (UVA+UVB protection) and wide brim hats (4-inch brim around the entire circumference of the hat). - Call for new or changing lesions.  Return for keep follow up as scheduled .  IRuthell Rummage, CMA, am acting as scribe for Sarina Ser, MD. Documentation: I have reviewed the above documentation for accuracy and completeness, and I agree with the above.  Sarina Ser, MD

## 2022-08-27 NOTE — Patient Instructions (Addendum)
Electrodesiccation and Curettage ("Scrape and Burn") Wound Care Instructions  Leave the original bandage on for 24 hours if possible.  If the bandage becomes soaked or soiled before that time, it is OK to remove it and examine the wound.  A small amount of post-operative bleeding is normal.  If excessive bleeding occurs, remove the bandage, place gauze over the site and apply continuous pressure (no peeking) over the area for 30 minutes. If this does not work, please call our clinic as soon as possible or page your doctor if it is after hours.   Once a day, cleanse the wound with soap and water. It is fine to shower. If a thick crust develops you may use a Q-tip dipped into dilute hydrogen peroxide (mix 1:1 with water) to dissolve it.  Hydrogen peroxide can slow the healing process, so use it only as needed.    After washing, apply petroleum jelly (Vaseline) or an antibiotic ointment if your doctor prescribed one for you, followed by a bandage.    For best healing, the wound should be covered with a layer of ointment at all times. If you are not able to keep the area covered with a bandage to hold the ointment in place, this may mean re-applying the ointment several times a day.  Continue this wound care until the wound has healed and is no longer open. It may take several weeks for the wound to heal and close.  Itching and mild discomfort is normal during the healing process.  If you have any discomfort, you can take Tylenol (acetaminophen) or ibuprofen as directed on the bottle. (Please do not take these if you have an allergy to them or cannot take them for another reason).  Some redness, tenderness and white or yellow material in the wound is normal healing.  If the area becomes very sore and red, or develops a thick yellow-green material (pus), it may be infected; please notify us.    Wound healing continues for up to one year following surgery. It is not unusual to experience pain in the scar  from time to time during the interval.  If the pain becomes severe or the scar thickens, you should notify the office.    A slight amount of redness in a scar is expected for the first six months.  After six months, the redness will fade and the scar will soften and fade.  The color difference becomes less noticeable with time.  If there are any problems, return for a post-op surgery check at your earliest convenience.  To improve the appearance of the scar, you can use silicone scar gel, cream, or sheets (such as Mederma or Serica) every night for up to one year. These are available over the counter (without a prescription).  Please call our office at [phone] for any questions or concerns.  Biopsy Wound Care Instructions  Leave the original bandage on for 24 hours if possible.  If the bandage becomes soaked or soiled before that time, it is OK to remove it and examine the wound.  A small amount of post-operative bleeding is normal.  If excessive bleeding occurs, remove the bandage, place gauze over the site and apply continuous pressure (no peeking) over the area for 30 minutes. If this does not work, please call our clinic as soon as possible or page your doctor if it is after hours.   Once a day, cleanse the wound with soap and water. It is fine to shower. If  a thick crust develops you may use a Q-tip dipped into dilute hydrogen peroxide (mix 1:1 with water) to dissolve it.  Hydrogen peroxide can slow the healing process, so use it only as needed.    After washing, apply petroleum jelly (Vaseline) or an antibiotic ointment if your doctor prescribed one for you, followed by a bandage.    For best healing, the wound should be covered with a layer of ointment at all times. If you are not able to keep the area covered with a bandage to hold the ointment in place, this may mean re-applying the ointment several times a day.  Continue this wound care until the wound has healed and is no longer  open.   Itching and mild discomfort is normal during the healing process. However, if you develop pain or severe itching, please call our office.   If you have any discomfort, you can take Tylenol (acetaminophen) or ibuprofen as directed on the bottle. (Please do not take these if you have an allergy to them or cannot take them for another reason).  Some redness, tenderness and white or yellow material in the wound is normal healing.  If the area becomes very sore and red, or develops a thick yellow-green material (pus), it may be infected; please notify us.    If you have stitches, return to clinic as directed to have the stitches removed. You will continue wound care for 2-3 days after the stitches are removed.   Wound healing continues for up to one year following surgery. It is not unusual to experience pain in the scar from time to time during the interval.  If the pain becomes severe or the scar thickens, you should notify the office.    A slight amount of redness in a scar is expected for the first six months.  After six months, the redness will fade and the scar will soften and fade.  The color difference becomes less noticeable with time.  If there are any problems, return for a post-op surgery check at your earliest convenience.  To improve the appearance of the scar, you can use silicone scar gel, cream, or sheets (such as Mederma or Serica) every night for up to one year. These are available over the counter (without a prescription).  Please call our office at [phone] for any questions or concerns.       Due to recent changes in healthcare laws, you may see results of your pathology and/or laboratory studies on MyChart before the doctors have had a chance to review them. We understand that in some cases there may be results that are confusing or concerning to you. Please understand that not all results are received at the same time and often the doctors may need to interpret  multiple results in order to provide you with the best plan of care or course of treatment. Therefore, we ask that you please give [place] 2 business days to thoroughly review all your results before contacting the office for clarification. Should we see a critical lab result, you will be contacted sooner.   If You Need Anything After Your Visit  If you have any questions or concerns for your doctor, please call our main line at [phone] and press option 4 to reach your doctor's medical assistant. If no one answers, please leave a voicemail as directed and we will return your call as soon as possible. Messages left after 4 pm will be answered the following business day.  You may also send [place] a message via [place]. We typically respond to MyChart messages within 1-2 business days.  For prescription refills, please ask your pharmacy to contact our office. Our fax number is [phone].  If you have an urgent issue when the clinic is closed that cannot wait until the next business day, you can page your doctor at the number below.    Please note that while we do our best to be available for urgent issues outside of office hours, we are not available 24/7.   If you have an urgent issue and are unable to reach [place], you may choose to seek medical care at your doctor's office, retail clinic, urgent care center, or emergency room.  If you have a medical emergency, please immediately call 911 or go to the emergency department.  Pager Numbers  - Dr. Nehemiah Massed: [phone]  - Dr. Laurence Ferrari: [phone]  - Dr. Nicole Kindred: [phone]  In the event of inclement weather, please call our main line at [phone] for an update on the status of any delays or closures.  Dermatology Medication Tips: Please keep the boxes that topical medications come in in order to help keep track of the instructions about where and how to use these. Pharmacies typically print the medication instructions only on the boxes and  not directly on the medication tubes.   If your medication is too expensive, please contact our office at [phone] option 4 or send [place] a message through [place].   We are unable to tell what your co-pay for medications will be in advance as this is different depending on your insurance coverage. However, we may be able to find a substitute medication at lower cost or fill out paperwork to get insurance to cover a needed medication.   If a prior authorization is required to get your medication covered by your insurance company, please allow [place] 1-2 business days to complete this process.  Drug prices often vary depending on where the prescription is filled and some pharmacies may offer cheaper prices.  The website www.goodrx.com contains coupons for medications through different pharmacies. The prices here do not account for what the cost may be with help from insurance (it may be cheaper with your insurance), but the website can give you the price if you did not use any insurance.  - You can print the associated coupon and take it with your prescription to the pharmacy.  - You may also stop by our office during regular business hours and pick up a GoodRx coupon card.  - If you need your prescription sent electronically to a different pharmacy, notify our office through [facility] or by phone at [phone] option 4.     Si Usted Necesita Algo Despus de Su Visita  Tambin puede enviarnos un mensaje a travs de Pharmacist, community. Por lo general respondemos a los mensajes de MyChart en el transcurso de 1 a 2 das hbiles.  Para renovar recetas, por favor pida a su farmacia que se ponga en contacto con nuestra oficina. Harland Dingwall de fax es [place] [phone].  Si tiene un asunto urgente cuando la clnica est cerrada y que no puede esperar hasta el siguiente da hbil, puede llamar/localizar a su doctor(a) al nmero que aparece a continuacin.   Por favor, tenga en cuenta que aunque  hacemos todo lo posible para estar disponibles para asuntos urgentes fuera del horario de [place], no estamos disponibles las 24 horas del da, los 7 das de la [place].  Si tiene un problema urgente y no puede comunicarse con nosotros, puede optar por buscar atencin mdica  en el consultorio de su doctor(a), en una clnica privada, en un centro de atencin urgente o en una sala de emergencias.  Si tiene Engineering geologist, por favor llame inmediatamente al 911 o vaya a la sala de emergencias.  Nmeros de bper  - Dr. Nehemiah Massed: [phone]  - Dra. Moye: [phone]  - Dra. Nicole Kindred: [phone]  En caso de inclemencias del [place], por favor llame a Johnsie Kindred principal al [phone] para una actualizacin sobre el [place] de cualquier retraso o cierre.  Consejos para la medicacin en dermatologa: Por favor, guarde las cajas en las que vienen los medicamentos de uso tpico para ayudarle a seguir las instrucciones sobre dnde y cmo usarlos. Las farmacias generalmente imprimen las instrucciones del medicamento slo en las cajas y no directamente en los tubos del [place].   Si su medicamento es muy caro, por favor, pngase en contacto con Zigmund Daniel llamando al [phone] y presione la opcin 4 o envenos un mensaje a travs de Pharmacist, community.   No podemos decirle cul ser su copago por los medicamentos por adelantado ya que esto es diferente dependiendo de la cobertura de su seguro. Sin embargo, es posible que podamos encontrar un medicamento sustituto a Electrical engineer un formulario para que el seguro cubra el medicamento que se considera necesario.   Si se requiere una autorizacin previa para que su compaa de seguros [place] su medicamento, por favor permtanos de 1 a 2 das hbiles para completar este proceso.  Los precios de los medicamentos varan con frecuencia dependiendo del Environmental consultant de dnde se surte la receta y alguna farmacias pueden ofrecer precios ms  baratos.  El sitio web www.goodrx.com tiene cupones para medicamentos de Airline pilot. Los precios aqu no tienen en cuenta lo que podra costar con la ayuda del seguro (puede ser ms barato con su seguro), pero el sitio web puede darle el precio si no utiliz Research scientist (physical sciences).  - Puede imprimir el cupn correspondiente y llevarlo con su receta a la farmacia.  - Tambin puede pasar por nuestra oficina durante el horario de atencin regular y Charity fundraiser una tarjeta de cupones de GoodRx.  - Si necesita que su receta se enve electrnicamente a una farmacia diferente, informe a nuestra oficina a travs de MyChart de [place] o por telfono llamando al [phone] y presione la opcin 4.

## 2022-09-02 ENCOUNTER — Encounter: Payer: Self-pay | Admitting: Dermatology

## 2022-09-03 ENCOUNTER — Telehealth: Payer: Self-pay

## 2022-09-03 NOTE — Telephone Encounter (Signed)
Lft pt msg to call for bx results/sh °

## 2022-09-03 NOTE — Telephone Encounter (Signed)
-----   Message from Ralene Bathe, MD sent at 08/30/2022  6:09 PM EST ----- Diagnosis Skin (M), left lateral calf WELL DIFFERENTIATED SQUAMOUS CELL CARCINOMA  Cancer - SCC Already treated Recheck next visit

## 2022-09-17 DIAGNOSIS — F4323 Adjustment disorder with mixed anxiety and depressed mood: Secondary | ICD-10-CM | POA: Diagnosis not present

## 2022-10-09 ENCOUNTER — Telehealth: Payer: Self-pay

## 2022-10-09 NOTE — Telephone Encounter (Signed)
Left message on voicemail to return my call.  

## 2022-10-09 NOTE — Telephone Encounter (Signed)
Opened in error

## 2022-10-09 NOTE — Telephone Encounter (Signed)
-----   Message from David C Kowalski, MD sent at 08/30/2022  6:09 PM EST ----- Diagnosis Skin (M), left lateral calf WELL DIFFERENTIATED SQUAMOUS CELL CARCINOMA  Cancer - SCC Already treated Recheck next visit 

## 2022-10-10 ENCOUNTER — Telehealth: Payer: Self-pay

## 2022-10-10 NOTE — Telephone Encounter (Signed)
Patient advised of BX result. aw 

## 2022-10-10 NOTE — Telephone Encounter (Signed)
-----   Message from David C Kowalski, MD sent at 08/30/2022  6:09 PM EST ----- Diagnosis Skin (M), left lateral calf WELL DIFFERENTIATED SQUAMOUS CELL CARCINOMA  Cancer - SCC Already treated Recheck next visit 

## 2022-10-19 DIAGNOSIS — F4323 Adjustment disorder with mixed anxiety and depressed mood: Secondary | ICD-10-CM | POA: Diagnosis not present

## 2022-10-19 DIAGNOSIS — I10 Essential (primary) hypertension: Secondary | ICD-10-CM | POA: Diagnosis not present

## 2022-10-19 DIAGNOSIS — Z0001 Encounter for general adult medical examination with abnormal findings: Secondary | ICD-10-CM | POA: Diagnosis not present

## 2022-10-19 DIAGNOSIS — Z0000 Encounter for general adult medical examination without abnormal findings: Secondary | ICD-10-CM | POA: Diagnosis not present

## 2022-10-19 DIAGNOSIS — Z1331 Encounter for screening for depression: Secondary | ICD-10-CM | POA: Diagnosis not present

## 2022-10-19 DIAGNOSIS — E785 Hyperlipidemia, unspecified: Secondary | ICD-10-CM | POA: Diagnosis not present

## 2022-10-22 ENCOUNTER — Other Ambulatory Visit: Payer: Self-pay | Admitting: Internal Medicine

## 2022-10-22 DIAGNOSIS — Z1231 Encounter for screening mammogram for malignant neoplasm of breast: Secondary | ICD-10-CM

## 2022-11-19 DIAGNOSIS — R002 Palpitations: Secondary | ICD-10-CM | POA: Diagnosis not present

## 2022-11-19 DIAGNOSIS — Z9861 Coronary angioplasty status: Secondary | ICD-10-CM | POA: Diagnosis not present

## 2022-11-19 DIAGNOSIS — R001 Bradycardia, unspecified: Secondary | ICD-10-CM | POA: Diagnosis not present

## 2022-11-19 DIAGNOSIS — I10 Essential (primary) hypertension: Secondary | ICD-10-CM | POA: Diagnosis not present

## 2022-11-19 DIAGNOSIS — E785 Hyperlipidemia, unspecified: Secondary | ICD-10-CM | POA: Diagnosis not present

## 2022-11-19 DIAGNOSIS — I2510 Atherosclerotic heart disease of native coronary artery without angina pectoris: Secondary | ICD-10-CM | POA: Diagnosis not present

## 2022-11-29 ENCOUNTER — Ambulatory Visit
Admission: RE | Admit: 2022-11-29 | Discharge: 2022-11-29 | Disposition: A | Discharge status: Home / Self Care | Payer: Medicare HMO | Source: Ambulatory Visit | Attending: Internal Medicine | Admitting: Internal Medicine

## 2022-11-29 DIAGNOSIS — Z1231 Encounter for screening mammogram for malignant neoplasm of breast: Secondary | ICD-10-CM | POA: Insufficient documentation

## 2023-04-10 DIAGNOSIS — I10 Essential (primary) hypertension: Secondary | ICD-10-CM | POA: Diagnosis not present

## 2023-04-17 ENCOUNTER — Ambulatory Visit: Payer: Medicare HMO | Admitting: Dermatology

## 2023-04-17 DIAGNOSIS — F4323 Adjustment disorder with mixed anxiety and depressed mood: Secondary | ICD-10-CM | POA: Diagnosis not present

## 2023-04-17 DIAGNOSIS — Z23 Encounter for immunization: Secondary | ICD-10-CM | POA: Diagnosis not present

## 2023-04-17 DIAGNOSIS — I10 Essential (primary) hypertension: Secondary | ICD-10-CM | POA: Diagnosis not present

## 2023-04-17 DIAGNOSIS — M6740 Ganglion, unspecified site: Secondary | ICD-10-CM | POA: Diagnosis not present

## 2023-04-17 DIAGNOSIS — E785 Hyperlipidemia, unspecified: Secondary | ICD-10-CM | POA: Diagnosis not present

## 2023-04-17 DIAGNOSIS — I2510 Atherosclerotic heart disease of native coronary artery without angina pectoris: Secondary | ICD-10-CM | POA: Diagnosis not present

## 2023-04-17 DIAGNOSIS — Z9861 Coronary angioplasty status: Secondary | ICD-10-CM | POA: Diagnosis not present

## 2023-04-29 DIAGNOSIS — M79645 Pain in left finger(s): Secondary | ICD-10-CM | POA: Diagnosis not present

## 2023-04-29 DIAGNOSIS — M19042 Primary osteoarthritis, left hand: Secondary | ICD-10-CM | POA: Diagnosis not present

## 2023-05-08 DIAGNOSIS — Z09 Encounter for follow-up examination after completed treatment for conditions other than malignant neoplasm: Secondary | ICD-10-CM | POA: Diagnosis not present

## 2023-05-08 DIAGNOSIS — Z860101 Personal history of adenomatous and serrated colon polyps: Secondary | ICD-10-CM | POA: Diagnosis not present

## 2023-05-22 DIAGNOSIS — Z9861 Coronary angioplasty status: Secondary | ICD-10-CM | POA: Diagnosis not present

## 2023-05-22 DIAGNOSIS — R001 Bradycardia, unspecified: Secondary | ICD-10-CM | POA: Diagnosis not present

## 2023-05-22 DIAGNOSIS — R0989 Other specified symptoms and signs involving the circulatory and respiratory systems: Secondary | ICD-10-CM | POA: Diagnosis not present

## 2023-05-22 DIAGNOSIS — E785 Hyperlipidemia, unspecified: Secondary | ICD-10-CM | POA: Diagnosis not present

## 2023-05-22 DIAGNOSIS — I10 Essential (primary) hypertension: Secondary | ICD-10-CM | POA: Diagnosis not present

## 2023-05-22 DIAGNOSIS — I2510 Atherosclerotic heart disease of native coronary artery without angina pectoris: Secondary | ICD-10-CM | POA: Diagnosis not present

## 2023-06-04 DIAGNOSIS — Z9861 Coronary angioplasty status: Secondary | ICD-10-CM | POA: Diagnosis not present

## 2023-06-04 DIAGNOSIS — I2510 Atherosclerotic heart disease of native coronary artery without angina pectoris: Secondary | ICD-10-CM | POA: Diagnosis not present

## 2023-06-04 DIAGNOSIS — I6523 Occlusion and stenosis of bilateral carotid arteries: Secondary | ICD-10-CM | POA: Diagnosis not present

## 2023-06-04 DIAGNOSIS — R0989 Other specified symptoms and signs involving the circulatory and respiratory systems: Secondary | ICD-10-CM | POA: Diagnosis not present

## 2023-07-12 ENCOUNTER — Encounter: Payer: Self-pay | Admitting: Gastroenterology

## 2023-07-25 ENCOUNTER — Encounter: Payer: Self-pay | Admitting: Gastroenterology

## 2023-08-01 ENCOUNTER — Encounter
Admission: RE | Disposition: A | Discharge status: Home / Self Care | Payer: Self-pay | Source: Home / Self Care | Attending: Gastroenterology

## 2023-08-01 ENCOUNTER — Ambulatory Visit: Payer: PPO | Admitting: Anesthesiology

## 2023-08-01 ENCOUNTER — Encounter: Payer: Self-pay | Admitting: Gastroenterology

## 2023-08-01 ENCOUNTER — Other Ambulatory Visit: Payer: Self-pay

## 2023-08-01 ENCOUNTER — Ambulatory Visit
Admission: RE | Admit: 2023-08-01 | Discharge: 2023-08-01 | Disposition: A | Discharge status: Home / Self Care | Payer: PPO | Attending: Gastroenterology | Admitting: Gastroenterology

## 2023-08-01 DIAGNOSIS — F32A Depression, unspecified: Secondary | ICD-10-CM | POA: Insufficient documentation

## 2023-08-01 DIAGNOSIS — I10 Essential (primary) hypertension: Secondary | ICD-10-CM | POA: Insufficient documentation

## 2023-08-01 DIAGNOSIS — I252 Old myocardial infarction: Secondary | ICD-10-CM | POA: Diagnosis not present

## 2023-08-01 DIAGNOSIS — K5730 Diverticulosis of large intestine without perforation or abscess without bleeding: Secondary | ICD-10-CM | POA: Insufficient documentation

## 2023-08-01 DIAGNOSIS — F419 Anxiety disorder, unspecified: Secondary | ICD-10-CM | POA: Diagnosis not present

## 2023-08-01 DIAGNOSIS — K640 First degree hemorrhoids: Secondary | ICD-10-CM | POA: Diagnosis not present

## 2023-08-01 DIAGNOSIS — Q438 Other specified congenital malformations of intestine: Secondary | ICD-10-CM | POA: Diagnosis not present

## 2023-08-01 DIAGNOSIS — Z87891 Personal history of nicotine dependence: Secondary | ICD-10-CM | POA: Diagnosis not present

## 2023-08-01 DIAGNOSIS — I2510 Atherosclerotic heart disease of native coronary artery without angina pectoris: Secondary | ICD-10-CM | POA: Diagnosis not present

## 2023-08-01 DIAGNOSIS — Z7982 Long term (current) use of aspirin: Secondary | ICD-10-CM | POA: Diagnosis not present

## 2023-08-01 DIAGNOSIS — K635 Polyp of colon: Secondary | ICD-10-CM | POA: Diagnosis not present

## 2023-08-01 DIAGNOSIS — Z1211 Encounter for screening for malignant neoplasm of colon: Secondary | ICD-10-CM | POA: Insufficient documentation

## 2023-08-01 DIAGNOSIS — Z860101 Personal history of adenomatous and serrated colon polyps: Secondary | ICD-10-CM | POA: Diagnosis not present

## 2023-08-01 DIAGNOSIS — Z79899 Other long term (current) drug therapy: Secondary | ICD-10-CM | POA: Diagnosis not present

## 2023-08-01 DIAGNOSIS — K649 Unspecified hemorrhoids: Secondary | ICD-10-CM | POA: Diagnosis not present

## 2023-08-01 HISTORY — PX: POLYPECTOMY: SHX5525

## 2023-08-01 HISTORY — PX: COLONOSCOPY WITH PROPOFOL: SHX5780

## 2023-08-01 SURGERY — COLONOSCOPY WITH PROPOFOL
Anesthesia: General

## 2023-08-01 MED ORDER — LIDOCAINE HCL (CARDIAC) PF 100 MG/5ML IV SOSY
PREFILLED_SYRINGE | INTRAVENOUS | Status: DC | PRN
  Administered 2023-08-01: 60 mg via INTRAVENOUS

## 2023-08-01 MED ORDER — SODIUM CHLORIDE 0.9 % IV SOLN: INTRAVENOUS | Status: DC

## 2023-08-01 MED ORDER — DEXMEDETOMIDINE HCL IN NACL 80 MCG/20ML IV SOLN
INTRAVENOUS | Status: DC | PRN
  Administered 2023-08-01: 12 ug via INTRAVENOUS
  Administered 2023-08-01: 8 ug via INTRAVENOUS

## 2023-08-01 MED ORDER — PROPOFOL 500 MG/50ML IV EMUL
INTRAVENOUS | Status: DC | PRN
  Administered 2023-08-01: 50 ug/kg/min via INTRAVENOUS

## 2023-08-01 MED ORDER — LIDOCAINE HCL (PF) 2 % IJ SOLN
INTRAMUSCULAR | Status: AC
  Filled 2023-08-01: qty 5

## 2023-08-01 MED ORDER — PROPOFOL 10 MG/ML IV BOLUS
INTRAVENOUS | Status: DC | PRN
  Administered 2023-08-01 (×2): 40 mg via INTRAVENOUS
  Administered 2023-08-01: 20 mg via INTRAVENOUS

## 2023-08-01 NOTE — Interval H&P Note (Signed)
History and Physical Interval Note: Preprocedure H&P from 08/01/23  was reviewed and there was no interval change after seeing and examining the patient.  Written consent was obtained from the patient after discussion of risks, benefits, and alternatives. Patient has consented to proceed with Colonoscopy with possible intervention   08/01/2023 9:14 AM  Makayla Burke  has presented today for surgery, with the diagnosis of Z86.0101 (ICD-10-CM) - Hx of adenomatous polyp of colon.  The various methods of treatment have been discussed with the patient and family. After consideration of risks, benefits and other options for treatment, the patient has consented to  Procedure(s): COLONOSCOPY WITH PROPOFOL (N/A) as a surgical intervention.  The patient's history has been reviewed, patient examined, no change in status, stable for surgery.  I have reviewed the patient's chart and labs.  Questions were answered to the patient's satisfaction.     [name]

## 2023-08-01 NOTE — H&P (Signed)
Pre-Procedure H&P   Patient ID: Makayla Burke is a 78 y.o. female.  Gastroenterology Provider: [name], DO  Referring Provider: Fransico Setters, NP PCP: Gracelyn Nurse, MD  Date: 08/01/2023  HPI Ms. Makayla Burke is a 78 y.o. female who presents today for Colonoscopy for phx colon polyps.  Patient last underwent colonoscopy in August 2019 with 1 adenomatous polyp.  Left-sided diverticulosis and internal hemorrhoids were noted.  2007 colonoscopy was negative.  Hemoglobin 12.8 MCV 87 platelets 222,000  Bowel movements are regular without melena or hematochezia.  No family history of colon cancer or colon polyps Hemoglobin 12.8 MCV 87 platelets 222,000   Past Medical History:  Diagnosis Date   Anxiety    Basal cell carcinoma 10/02/2021   Left upper back paraspinal - [facility]   Cancer (HCC)    skin   Coronary artery disease    Depression    Hematoma    Hypercholesteremia    Hypertension    Myocardial infarction (HCC)    2006   Squamous cell carcinoma of skin 07/2018   left upper back   Squamous cell carcinoma of skin 10/02/2021   Right mid post calf - [facility]   Squamous cell carcinoma of skin 08/27/2022   L lat calf, [facility]    Past Surgical History:  Procedure Laterality Date   CATARACT EXTRACTION W/PHACO Right 12/20/2014   Procedure: CATARACT EXTRACTION PHACO AND INTRAOCULAR LENS PLACEMENT (IOC);  Surgeon: Sallee Lange, MD;  Location: [facility] ORS;  Service: Ophthalmology;  Laterality: Right;  [place] 00:50 AP% 22.2 CDE 21.36   CATARACT EXTRACTION W/PHACO Left 03/28/2015   Procedure: CATARACT EXTRACTION PHACO AND INTRAOCULAR LENS PLACEMENT (IOC);  Surgeon: Sallee Lange, MD;  Location: [facility] ORS;  Service: Ophthalmology;  Laterality: Left;  [place] 0 :43.8 AP 20.7% CDE16.65 casette lot #[id number]   COLONOSCOPY     COLONOSCOPY WITH PROPOFOL N/A 02/07/2018   Procedure: COLONOSCOPY WITH PROPOFOL;  Surgeon: Scot Jun, MD;  Location: [facility] ENDOSCOPY;  Service:  Endoscopy;  Laterality: N/A;   CORONARY ANGIOPLASTY     stents 2006   EYE SURGERY     FINGER SURGERY      Family History No h/o GI disease or malignancy  Review of Systems  Constitutional:  Negative for activity change, appetite change, chills, diaphoresis, fatigue, fever and unexpected weight change.  HENT:  Negative for trouble swallowing and voice change.   Respiratory:  Negative for shortness of breath and wheezing.   Cardiovascular:  Negative for chest pain, palpitations and leg swelling.  Gastrointestinal:  Negative for abdominal distention, abdominal pain, anal bleeding, blood in stool, constipation, diarrhea, nausea, rectal pain and vomiting.  Musculoskeletal:  Negative for arthralgias and myalgias.  Skin:  Negative for color change and pallor.  Neurological:  Negative for dizziness, syncope and weakness.  Psychiatric/Behavioral:  Negative for confusion.   All other systems reviewed and are negative.    Medications No current facility-administered medications on file prior to encounter.   Current Outpatient Medications on File Prior to Encounter  Medication Sig Dispense Refill   aspirin EC 81 MG tablet Take 81 mg by mouth daily.     hydrochlorothiazide (MICROZIDE) 12.5 MG capsule Take 12.5 mg by mouth daily.     ramipril (ALTACE) 10 MG capsule Take 10 mg by mouth daily.     simvastatin (ZOCOR) 40 MG tablet Take 40 mg by mouth daily.     metoprolol succinate (TOPROL-XL) 25 MG 24 hr tablet Take 25 mg by  mouth daily. (Patient not taking: Reported on 08/01/2023)     PARoxetine (PAXIL) 10 MG tablet Take 10 mg by mouth daily.      Pertinent medications related to GI and procedure were reviewed by me with the patient prior to the procedure   Current Facility-Administered Medications:    0.9 %  sodium chloride infusion, , Intravenous, Continuous, [name], DO, Last Rate: 20 mL/hr at 08/01/23 0817, New Bag at 08/01/23 0817  sodium chloride 20 mL/hr at 08/01/23 4098        Allergies  Allergen Reactions   Penicillins    Allergies were reviewed by me prior to the procedure  Objective   Body mass index is 23.89 kg/m. Vitals:   08/01/23 0807  BP: (!) 141/73  Pulse: (!) 56  Resp: 18  Temp: (!) 96.9 F (36.1 C)  TempSrc: Temporal  SpO2: 98%  Weight: 63.1 kg  Height: 5\' 4"  (1.626 m)     Physical Exam Vitals and nursing note reviewed.  Constitutional:      General: She is not in acute distress.    Appearance: Normal appearance. She is not ill-appearing, toxic-appearing or diaphoretic.  HENT:     Head: Normocephalic and atraumatic.     Nose: Nose normal.     Mouth/Throat:     Mouth: Mucous membranes are moist.     Pharynx: Oropharynx is clear.  Eyes:     General: No scleral icterus.    Extraocular Movements: Extraocular movements intact.  Cardiovascular:     Rate and Rhythm: Regular rhythm. Bradycardia present.     Heart sounds: Normal heart sounds. No murmur heard.    No friction rub. No gallop.  Pulmonary:     Effort: Pulmonary effort is normal. No respiratory distress.     Breath sounds: Normal breath sounds. No wheezing, rhonchi or rales.  Abdominal:     General: Bowel sounds are normal. There is no distension.     Palpations: Abdomen is soft.     Tenderness: There is no abdominal tenderness. There is no guarding or rebound.  Musculoskeletal:     Cervical back: Neck supple.     Right lower leg: No edema.     Left lower leg: No edema.  Skin:    General: Skin is warm and dry.     Coloration: Skin is not jaundiced or pale.  Neurological:     General: No focal deficit present.     Mental Status: She is alert and oriented to person, place, and time. Mental status is at baseline.  Psychiatric:        Mood and Affect: Mood normal.        Behavior: Behavior normal.        Thought Content: Thought content normal.        Judgment: Judgment normal.      Assessment:  Ms. Makayla Burke is a 78 y.o. female  who presents  today for Colonoscopy for phx colon polyps .  Plan:  Colonoscopy with possible intervention today  Colonoscopy with possible biopsy, control of bleeding, polypectomy, and interventions as necessary has been discussed with the patient/patient representative. Informed consent was obtained from the patient/patient representative after explaining the indication, nature, and risks of the procedure including but not limited to death, bleeding, perforation, missed neoplasm/lesions, cardiorespiratory compromise, and reaction to medications. Opportunity for questions was given and appropriate answers were provided. Patient/patient representative has verbalized understanding is amenable to undergoing the procedure.   Thomas Hoff  Timothy Lasso, DO  [facility] Gastroenterology  Portions of the record may have been created with voice recognition software. Occasional wrong-word or 'sound-a-like' substitutions may have occurred due to the inherent limitations of voice recognition software.  Read the chart carefully and recognize, using context, where substitutions may have occurred.

## 2023-08-01 NOTE — Transfer of Care (Signed)
Immediate Anesthesia Transfer of Care Note  Patient: Makayla Burke  Procedure(s) Performed: COLONOSCOPY WITH PROPOFOL POLYPECTOMY  Patient Location: PACU  Anesthesia Type:General  Level of Consciousness: sedated  Airway & Oxygen Therapy: Patient Spontanous Breathing  Post-op Assessment: Report given to RN and Post -op Vital signs reviewed and stable  Post vital signs: Reviewed and stable  Last Vitals:  Vitals Value Taken Time  BP 87/60 08/01/23 0958  Temp    Pulse 46 08/01/23 0958  Resp    SpO2 99 % 08/01/23 0958  Vitals shown include unfiled device data.  Last Pain:  Vitals:   08/01/23 0807  TempSrc: Temporal  PainSc: 0-No pain         Complications: No notable events documented.

## 2023-08-01 NOTE — Anesthesia Preprocedure Evaluation (Signed)
Anesthesia Evaluation  Patient identified by MRN, date of birth, ID band Patient awake    Reviewed: Allergy & Precautions, NPO status , Patient's Chart, lab work & pertinent test results  History of Anesthesia Complications Negative for: history of anesthetic complications  Airway Mallampati: II       Dental  (+) Dental Advidsory Given, Caps, Implants   Pulmonary neg shortness of breath, neg sleep apnea, neg COPD, neg recent URI, former smoker          Cardiovascular hypertension, Pt. on medications (-) angina + CAD, + Past MI and + Cardiac Stents  (-) CHF (-) dysrhythmias (-) Valvular Problems/Murmurs     Neuro/Psych neg Seizures  Anxiety Depression       GI/Hepatic Neg liver ROS,neg GERD  ,,  Endo/Other  neg diabetes    Renal/GU negative Renal ROS     Musculoskeletal   Abdominal   Peds  Hematology   Anesthesia Other Findings   Reproductive/Obstetrics                             Anesthesia Physical Anesthesia Plan  ASA: 3  Anesthesia Plan: General   Post-op Pain Management:    Induction: Intravenous  PONV Risk Score and Plan: 3 and Propofol infusion, TIVA and Treatment may vary due to age or medical condition  Airway Management Planned: Nasal Cannula and Natural Airway  Additional Equipment:   Intra-op Plan:   Post-operative Plan:   Informed Consent: I have reviewed the patients History and Physical, chart, labs and discussed the procedure including the risks, benefits and alternatives for the proposed anesthesia with the patient or authorized representative who has indicated his/her understanding and acceptance.       Plan Discussed with:   Anesthesia Plan Comments:         Anesthesia Quick Evaluation

## 2023-08-01 NOTE — Op Note (Signed)
[facility] Patient Name: Makayla Burke Procedure Date: 08/01/2023 9:02 AM MRN: [id number] Account #: [id number] Date of Birth: Jul 24, 1945 Admit Type: Outpatient Age: 78 Room: [facility] ENDO ROOM 1 Gender: Female Note Status: Finalized Instrument Name: Colonscope [id number] Procedure:             Colonoscopy Indications:           High risk colon cancer surveillance: Personal history                         of colonic polyps Providers:             Trenda Moots, DO Referring MD:          Craig Guess, MD (Referring MD) Medicines:             Monitored Anesthesia Care Complications:         No immediate complications. Estimated blood loss:                         Minimal. Procedure:             Pre-Anesthesia Assessment:                        - Prior to the procedure, a History and Physical was                         performed, and patient medications and allergies were                         reviewed. The patient is competent. The risks and                         benefits of the procedure and the sedation options and                         risks were discussed with the patient. All questions                         were answered and informed consent was obtained.                         Patient identification and proposed procedure were                         verified by the physician, the nurse, the anesthetist                         and the technician in the endoscopy suite. Mental                         Status Examination: alert and oriented. Airway                         Examination: normal oropharyngeal airway and neck                         mobility. Respiratory Examination: clear to  auscultation. CV Examination: bradycardia noted.                         Prophylactic Antibiotics: The patient does not require                         prophylactic antibiotics. Prior Anticoagulants: The                          patient has taken no anticoagulant or antiplatelet                         agents. ASA Grade Assessment: III - A patient with                         severe systemic disease. After reviewing the risks and                         benefits, the patient was deemed in satisfactory                         condition to undergo the procedure. The anesthesia                         plan was to use monitored anesthesia care (MAC).                         Immediately prior to administration of medications,                         the patient was re-assessed for adequacy to receive                         sedatives. The heart rate, respiratory rate, oxygen                         saturations, blood pressure, adequacy of pulmonary                         ventilation, and response to care were monitored                         throughout the procedure. The physical status of the                         patient was re-assessed after the procedure.                        After obtaining informed consent, the colonoscope was                         passed under direct vision. Throughout the procedure,                         the patient's blood pressure, pulse, and oxygen                         saturations were monitored continuously. The  Colonoscope was introduced through the anus and                         advanced to the the cecum, identified by appendiceal                         orifice and ileocecal valve. The colonoscopy was                         somewhat difficult due to multiple diverticula in the                         colon and a redundant colon. Successful completion of                         the procedure was aided by changing the patient to a                         supine position, withdrawing the scope and replacing                         with the pediatric colonoscope, applying abdominal                         pressure and lavage. The patient tolerated  the                         procedure well. The quality of the bowel preparation                         was evaluated using the BBPS [facility] Bowel Preparation                         Scale) with scores of: Right Colon = 3, Transverse                         Colon = 3 and Left Colon = 3 (entire mucosa seen well                         with no residual staining, small fragments of stool or                         opaque liquid). The total BBPS score equals 9. The                         ileocecal valve, appendiceal orifice, and rectum were                         photographed. Findings:      A 1 mm polyp was found in the recto-sigmoid colon. The polyp was       sessile. The polyp was removed with a jumbo cold forceps. Resection and       retrieval were complete. Estimated blood loss was minimal.      Multiple small-mouthed diverticula were found in the left colon.       Estimated blood loss: none.      Non-bleeding internal hemorrhoids were  found during retroflexion. The       hemorrhoids were Grade I (internal hemorrhoids that do not prolapse).       Estimated blood loss: none.      The recto-sigmoid colon was significantly tortuous. Patient with       restricted movement of the rectosigmoid 15-20cm from the anus. Multiple       tics. Due care was taken to navigate this section with lavage, abdominal       pressure, and position change as described above. Estimated blood loss:       none.      The exam was otherwise without abnormality on direct and retroflexion       views. Impression:            - One 1 mm polyp at the recto-sigmoid colon, removed                         with a jumbo cold forceps. Resected and retrieved.                        - Diverticulosis in the left colon.                        - Non-bleeding internal hemorrhoids.                        - Tortuous colon.                        - The examination was otherwise normal on direct and                          retroflexion views. Recommendation:        - Patient has a contact number available for                         emergencies. The signs and symptoms of potential                         delayed complications were discussed with the patient.                         Return to normal activities tomorrow. Written                         discharge instructions were provided to the patient.                        - Discharge patient to home.                        - Resume previous diet.                        - Continue present medications.                        - Await pathology results.                        - No further repeat surveillance colonoscopy given  advanced age at next due colonoscopy.                        - The findings and recommendations were discussed with                         the patient. Procedure Code(s):     --- Professional ---                        [phone], Colonoscopy, flexible; with biopsy, single or                         multiple Diagnosis Code(s):     --- Professional ---                        Z86.010, Personal history of colonic polyps                        D12.7, Benign neoplasm of rectosigmoid junction                        K64.0, First degree hemorrhoids                        K57.30, Diverticulosis of large intestine without                         perforation or abscess without bleeding                        Q43.8, Other specified congenital malformations of                         intestine CPT copyright 2022 [employer]. All rights reserved. The codes documented in this report are preliminary and upon coder review may  be revised to meet current compliance requirements. Attending Participation:      I personally performed the entire procedure. Elfredia Nevins, DO [name] DO, DO 08/01/2023 10:04:30 AM This report has been signed electronically. Number of Addenda: 0 Note Initiated On: 08/01/2023  9:02 AM Scope Withdrawal Time: 0 hours 9 minutes 33 seconds  Total Procedure Duration: 0 hours 29 minutes 54 seconds  Estimated Blood Loss:  Estimated blood loss was minimal.      [facility]

## 2023-08-01 NOTE — Anesthesia Postprocedure Evaluation (Signed)
Anesthesia Post Note  Patient: Makayla Burke  Procedure(s) Performed: COLONOSCOPY WITH PROPOFOL POLYPECTOMY  Patient location during evaluation: Endoscopy Anesthesia Type: General Level of consciousness: awake and alert Pain management: pain level controlled Vital Signs Assessment: post-procedure vital signs reviewed and stable Respiratory status: spontaneous breathing, nonlabored ventilation, respiratory function stable and patient connected to nasal cannula oxygen Cardiovascular status: blood pressure returned to baseline and stable Postop Assessment: no apparent nausea or vomiting Anesthetic complications: no   No notable events documented.   Last Vitals:  Vitals:   08/01/23 1009 08/01/23 1019  BP: (!) 99/56 (!) 101/59  Pulse: (!) 48 (!) 45  Resp:  14  Temp:    SpO2: 99% 100%    Last Pain:  Vitals:   08/01/23 1019  TempSrc:   PainSc: 0-No pain                 Lenard Simmer

## 2023-08-02 LAB — SURGICAL PATHOLOGY

## 2023-11-13 DIAGNOSIS — Z0000 Encounter for general adult medical examination without abnormal findings: Secondary | ICD-10-CM | POA: Diagnosis not present

## 2023-11-20 DIAGNOSIS — Z0000 Encounter for general adult medical examination without abnormal findings: Secondary | ICD-10-CM | POA: Diagnosis not present

## 2023-11-20 DIAGNOSIS — E785 Hyperlipidemia, unspecified: Secondary | ICD-10-CM | POA: Diagnosis not present

## 2023-11-20 DIAGNOSIS — I10 Essential (primary) hypertension: Secondary | ICD-10-CM | POA: Diagnosis not present

## 2023-11-20 DIAGNOSIS — Z0001 Encounter for general adult medical examination with abnormal findings: Secondary | ICD-10-CM | POA: Diagnosis not present

## 2023-11-20 DIAGNOSIS — F4323 Adjustment disorder with mixed anxiety and depressed mood: Secondary | ICD-10-CM | POA: Diagnosis not present

## 2023-11-20 DIAGNOSIS — Z9861 Coronary angioplasty status: Secondary | ICD-10-CM | POA: Diagnosis not present

## 2023-11-20 DIAGNOSIS — I2510 Atherosclerotic heart disease of native coronary artery without angina pectoris: Secondary | ICD-10-CM | POA: Diagnosis not present

## 2023-11-21 ENCOUNTER — Ambulatory Visit: Payer: Medicare HMO | Admitting: Dermatology

## 2023-11-21 DIAGNOSIS — L720 Epidermal cyst: Secondary | ICD-10-CM | POA: Diagnosis not present

## 2023-11-21 DIAGNOSIS — L821 Other seborrheic keratosis: Secondary | ICD-10-CM

## 2023-11-21 DIAGNOSIS — Z5111 Encounter for antineoplastic chemotherapy: Secondary | ICD-10-CM

## 2023-11-21 DIAGNOSIS — D1801 Hemangioma of skin and subcutaneous tissue: Secondary | ICD-10-CM | POA: Diagnosis not present

## 2023-11-21 DIAGNOSIS — Z85828 Personal history of other malignant neoplasm of skin: Secondary | ICD-10-CM

## 2023-11-21 DIAGNOSIS — L578 Other skin changes due to chronic exposure to nonionizing radiation: Secondary | ICD-10-CM

## 2023-11-21 DIAGNOSIS — L814 Other melanin hyperpigmentation: Secondary | ICD-10-CM

## 2023-11-21 DIAGNOSIS — D692 Other nonthrombocytopenic purpura: Secondary | ICD-10-CM

## 2023-11-21 DIAGNOSIS — L570 Actinic keratosis: Secondary | ICD-10-CM | POA: Diagnosis not present

## 2023-11-21 DIAGNOSIS — W908XXA Exposure to other nonionizing radiation, initial encounter: Secondary | ICD-10-CM

## 2023-11-21 DIAGNOSIS — L729 Follicular cyst of the skin and subcutaneous tissue, unspecified: Secondary | ICD-10-CM

## 2023-11-21 DIAGNOSIS — Z7189 Other specified counseling: Secondary | ICD-10-CM | POA: Diagnosis not present

## 2023-11-21 DIAGNOSIS — Z79899 Other long term (current) drug therapy: Secondary | ICD-10-CM

## 2023-11-21 DIAGNOSIS — L820 Inflamed seborrheic keratosis: Secondary | ICD-10-CM

## 2023-11-21 DIAGNOSIS — Z1283 Encounter for screening for malignant neoplasm of skin: Secondary | ICD-10-CM | POA: Diagnosis not present

## 2023-11-21 DIAGNOSIS — Z8589 Personal history of malignant neoplasm of other organs and systems: Secondary | ICD-10-CM

## 2023-11-21 DIAGNOSIS — D229 Melanocytic nevi, unspecified: Secondary | ICD-10-CM

## 2023-11-21 MED ORDER — FLUOROURACIL 5 % EX CREA: TOPICAL_CREAM | CUTANEOUS | 0 refills | Status: DC

## 2023-11-21 NOTE — Progress Notes (Signed)
 Follow-Up Visit   Subjective  Makayla Burke is a 78 y.o. female who presents for the following: Skin Cancer Screening and Full Body Skin Exam  The patient presents for Total-Body Skin Exam (TBSE) for skin cancer screening and mole check. The patient has spots, moles and lesions to be evaluated, some may be new or changing and the patient may have concern these could be cancer.  The following portions of the chart were reviewed this encounter and updated as appropriate: medications, allergies, medical history  Review of Systems:  No other skin or systemic complaints except as noted in HPI or Assessment and Plan.  Objective  Well appearing patient in no apparent distress; mood and affect are within normal limits.  A full examination was performed including scalp, head, eyes, ears, nose, lips, neck, chest, axillae, abdomen, back, buttocks, bilateral upper extremities, bilateral lower extremities, hands, feet, fingers, toes, fingernails, and toenails. All findings within normal limits unless otherwise noted below.   Relevant physical exam findings are noted in the Assessment and Plan.  Forehead, temples, nose, and lip x 8 (8) Erythematous thin papules/macules with gritty scale.  R pretibial x 1, R calf x 1 (2) Erythematous stuck-on, waxy papule or plaque  Assessment & Plan   SKIN CANCER SCREENING PERFORMED TODAY.  ACTINIC DAMAGE - Chronic condition, secondary to cumulative UV/sun exposure - diffuse scaly erythematous macules with underlying dyspigmentation - Recommend daily broad spectrum sunscreen SPF 30+ to sun-exposed areas, reapply every 2 hours as needed.  - Staying in the shade or wearing long sleeves, sun glasses (UVA+UVB protection) and wide brim hats (4-inch brim around the entire circumference of the hat) are also recommended for sun protection.  - Call for new or changing lesions.  LENTIGINES, SEBORRHEIC KERATOSES, HEMANGIOMAS - Benign normal skin lesions -  Benign-appearing - Call for any changes  MELANOCYTIC NEVI - Tan-brown and/or pink-flesh-colored symmetric macules and papules - Benign appearing on exam today - Observation - Call clinic for new or changing moles - Recommend daily use of broad spectrum spf 30+ sunscreen to sun-exposed areas.   HISTORY OF SQUAMOUS CELL CARCINOMA OF THE SKIN - No evidence of recurrence today - No lymphadenopathy - Recommend regular full body skin exams - Recommend daily broad spectrum sunscreen SPF 30+ to sun-exposed areas, reapply every 2 hours as needed.  - Call if any new or changing lesions are noted between office visits   AK (ACTINIC KERATOSIS) (8) Forehead, temples, nose, and lip x 8 (8) Actinic keratoses are precancerous spots that appear secondary to cumulative UV radiation exposure/sun exposure over time. They are chronic with expected duration over 1 year. A portion of actinic keratoses will progress to squamous cell carcinoma of the skin. It is not possible to reliably predict which spots will progress to skin cancer and so treatment is recommended to prevent development of skin cancer.  Recommend daily broad spectrum sunscreen SPF 30+ to sun-exposed areas, reapply every 2 hours as needed.  Recommend staying in the shade or wearing long sleeves, sun glasses (UVA+UVB protection) and wide brim hats (4-inch brim around the entire circumference of the hat). Call for new or changing lesions.  After July 4th apply a thin coat of 5FU/Calcipotriene mix to the forehead and temples BID x 7 days.    ACTINIC DAMAGE WITH PRECANCEROUS ACTINIC KERATOSES Counseling for Topical Chemotherapy Management: Patient exhibits: - Severe, confluent actinic changes with pre-cancerous actinic keratoses that is secondary to cumulative UV radiation exposure over time - Condition that  is severe; chronic, not at goal. - diffuse scaly erythematous macules and papules with underlying dyspigmentation - Discussed  Prescription "Field Treatment" topical Chemotherapy for Severe, Chronic Confluent Actinic Changes with Pre-Cancerous Actinic Keratoses Field treatment involves treatment of an entire area of skin that has confluent Actinic Changes (Sun/ Ultraviolet light damage) and PreCancerous Actinic Keratoses by method of PhotoDynamic Therapy (PDT) and/or prescription Topical Chemotherapy agents such as 5-fluorouracil , 5-fluorouracil /calcipotriene, and/or imiquimod.  The purpose is to decrease the number of clinically evident and subclinical PreCancerous lesions to prevent progression to development of skin cancer by chemically destroying early precancer changes that may or may not be visible.  It has been shown to reduce the risk of developing skin cancer in the treated area. As a result of treatment, redness, scaling, crusting, and open sores may occur during treatment course. One or more than one of these methods may be used and may have to be used several times to control, suppress and eliminate the PreCancerous changes. Discussed treatment course, expected reaction, and possible side effects. - Recommend daily broad spectrum sunscreen SPF 30+ to sun-exposed areas, reapply every 2 hours as needed.  - Staying in the shade or wearing long sleeves, sun glasses (UVA+UVB protection) and wide brim hats (4-inch brim around the entire circumference of the hat) are also recommended. - Call for new or changing lesions.  Destruction of lesion - Forehead, temples, nose, and lip x 8 (8) Complexity: simple   Destruction method: cryotherapy   Informed consent: discussed and consent obtained   Timeout:  patient name, date of birth, surgical site, and procedure verified Lesion destroyed using liquid nitrogen: Yes   Region frozen until ice ball extended beyond lesion: Yes   Outcome: patient tolerated procedure well with no complications   Post-procedure details: wound care instructions given   INFLAMED SEBORRHEIC KERATOSIS  (2) R pretibial x 1, R calf x 1 (2) Symptomatic, irritating, patient would like treated.  Destruction of lesion - R pretibial x 1, R calf x 1 (2) Complexity: simple   Destruction method: cryotherapy   Informed consent: discussed and consent obtained   Timeout:  patient name, date of birth, surgical site, and procedure verified Lesion destroyed using liquid nitrogen: Yes   Region frozen until ice ball extended beyond lesion: Yes   Outcome: patient tolerated procedure well with no complications   Post-procedure details: wound care instructions given   SKIN CANCER SCREENING   ACTINIC SKIN DAMAGE   CYST OF SKIN   LENTIGO   MELANOCYTIC NEVUS, UNSPECIFIED LOCATION   PURPURA (HCC)   SEBORRHEIC KERATOSIS   HISTORY OF SQUAMOUS CELL CARCINOMA    Purpura - Chronic; persistent and recurrent.  Treatable, but not curable. - Violaceous macules and patches - Benign - Related to trauma, age, sun damage and/or use of blood thinners, chronic use of topical and/or oral steroids - Observe - Can use OTC arnica containing moisturizer such as Dermend Bruise Formula if desired - Call for worsening or other concerns  EPIDERMAL INCLUSION CYST Exam: Subcutaneous nodule at R and L axilla  Benign-appearing. Exam most consistent with an epidermal inclusion cyst. Discussed that a cyst is a benign growth that can grow over time and sometimes get irritated or inflamed. Recommend observation if it is not bothersome. Discussed option of surgical excision to remove it if it is growing, symptomatic, or other changes noted. Please call for new or changing lesions so they can be evaluated.  Return in about 6 months (around 05/23/2024) for AK follow  up.  Arlinda Lais, CMA, am acting as scribe for Celine Collard, MD .   Documentation: I have reviewed the above documentation for accuracy and completeness, and I agree with the above.  Celine Collard, MD

## 2023-11-21 NOTE — Patient Instructions (Signed)
Instructions for [facility] Medications  One or more of your medications was sent to the [facility] mail order compounding pharmacy. You will receive an email from them and can purchase the medicine through that link. It will then be mailed to your home at the address you confirmed. If for any reason you do not receive an email from them, please check your spam folder. If you still do not find the email, please let us know. [facility] phone number is [phone].  5-Fluorouracil/Calcipotriene Patient Education   Actinic keratoses are the dry, red scaly spots on the skin caused by sun damage. A portion of these spots can turn into skin cancer with time, and treating them can help prevent development of skin cancer.   Treatment of these spots requires removal of the defective skin cells. There are various ways to remove actinic keratoses, including freezing with liquid nitrogen, treatment with creams, or treatment with a blue light procedure in the office.   5-fluorouracil cream is a topical cream used to treat actinic keratoses. It works by interfering with the growth of abnormal fast-growing skin cells, such as actinic keratoses. These cells peel off and are replaced by healthy ones. THIS CREAM SHOULD BE KEPT OUT OF REACH OF CHILDREN AND PETS AND SHOULD NOT BE USED BY PREGNANT WOMEN.  5-fluorouracil/calcipotriene is a combination of the 5-fluorouracil cream with a vitamin D analog cream called calcipotriene. The calcipotriene alone does not treat actinic keratoses. However, when it is combined with 5-fluorouracil, it helps the 5-fluorouracil treat the actinic keratoses much faster so that the same results can be achieved with a much shorter treatment time.  INSTRUCTIONS FOR 5-FLUOROURACIL/CALCIPOTRIENE CREAM:   5-fluorouracil/calcipotriene cream typically only needs to be used for 4-7 days. A thin layer should be applied twice a day to the treatment areas recommended by your  physician.   If your physician prescribed you separate tubes of 5-fluourouracil and calcipotriene, apply a thin layer of 5-fluorouracil followed by a thin layer of calcipotriene.   Avoid contact with your eyes or nostrils. Avoid applying the cream to your eyelids or lips unless directed to apply there by your physician. Do not use 5-fluorouracil/calcipotriene cream on infected or open wounds.   You will develop redness, irritation and some crusting at areas where you have pre-cancer damage/actinic keratoses. IF YOU DEVELOP PAIN, BLEEDING, OR SIGNIFICANT CRUSTING, STOP THE TREATMENT EARLY - you have already gotten a good response and the actinic keratoses should clear up well.  Wash your hands after applying 5-fluorouracil 5% cream on your skin.   A moisturizer or sunscreen with a minimum SPF 30 should be applied each morning.   Once you have finished the treatment, you can apply a thin layer of Vaseline twice a day to irritated areas to soothe and calm the areas more quickly. If you experience significant discomfort, contact your physician.  For some patients it is necessary to repeat the treatment for best results.  SIDE EFFECTS: When using 5-fluorouracil/calcipotriene cream, you may have mild irritation, such as redness, dryness, swelling, or a mild burning sensation. This usually resolves within 2 weeks. The more actinic keratoses you have, the more redness and inflammation you can expect during treatment. Eye irritation has been reported rarely. If this occurs, please let us know.   If you have any trouble using this cream, please send [place] a MyChart message or call the office. If you have any other questions about this information, please do not hesitate to ask me before  you leave the office or contact me on MyChart or by phone.    Due to recent changes in healthcare laws, you may see results of your pathology and/or laboratory studies on MyChart before the doctors have had a chance to  review them. We understand that in some cases there may be results that are confusing or concerning to you. Please understand that not all results are received at the same time and often the doctors may need to interpret multiple results in order to provide you with the best plan of care or course of treatment. Therefore, we ask that you please give [place] 2 business days to thoroughly review all your results before contacting the office for clarification. Should we see a critical lab result, you will be contacted sooner.   If You Need Anything After Your Visit  If you have any questions or concerns for your doctor, please call our main line at [phone] and press option 4 to reach your doctor's medical assistant. If no one answers, please leave a voicemail as directed and we will return your call as soon as possible. Messages left after 4 pm will be answered the following business day.   You may also send [place] a message via MyChart. We typically respond to MyChart messages within 1-2 business days.  For prescription refills, please ask your pharmacy to contact our office. Our fax number is [phone].  If you have an urgent issue when the clinic is closed that cannot wait until the next business day, you can page your doctor at the number below.    Please note that while we do our best to be available for urgent issues outside of office hours, we are not available 24/7.   If you have an urgent issue and are unable to reach [place], you may choose to seek medical care at your doctor's office, retail clinic, urgent care center, or emergency room.  If you have a medical emergency, please immediately call 911 or go to the emergency department.  Pager Numbers  - Dr. Gwen Pounds: [phone]  - Dr. Roseanne Reno: [phone]  - Dr. Katrinka Blazing: [phone]   In the event of inclement weather, please call our main line at [phone] for an update on the status of any delays or closures.  Dermatology  Medication Tips: Please keep the boxes that topical medications come in in order to help keep track of the instructions about where and how to use these. Pharmacies typically print the medication instructions only on the boxes and not directly on the medication tubes.   If your medication is too expensive, please contact our office at [phone] option 4 or send [place] a message through MyChart.   We are unable to tell what your co-pay for medications will be in advance as this is different depending on your insurance coverage. However, we may be able to find a substitute medication at lower cost or fill out paperwork to get insurance to cover a needed medication.   If a prior authorization is required to get your medication covered by your insurance company, please allow [place] 1-2 business days to complete this process.  Drug prices often vary depending on where the prescription is filled and some pharmacies may offer cheaper prices.  The website www.goodrx.com contains coupons for medications through different pharmacies. The prices here do not account for what the cost may be with help from insurance (it may be cheaper with your insurance), but the website can give you the price if you  did not use any insurance.  - You can print the associated coupon and take it with your prescription to the pharmacy.  - You may also stop by our office during regular business hours and pick up a GoodRx coupon card.  - If you need your prescription sent electronically to a different pharmacy, notify our office through [facility] or by phone at [phone] option 4.     Si Usted Necesita Algo Despus de Su Visita  Tambin puede enviarnos un mensaje a travs de Clinical cytogeneticist. Por lo general respondemos a los mensajes de MyChart en el transcurso de 1 a 2 das hbiles.  Para renovar recetas, por favor pida a su farmacia que se ponga en contacto con nuestra oficina. Annie Sable de fax es [place] [phone].  Si  tiene un asunto urgente cuando la clnica est cerrada y que no puede esperar hasta el siguiente da hbil, puede llamar/localizar a su doctor(a) al nmero que aparece a continuacin.   Por favor, tenga en cuenta que aunque hacemos todo lo posible para estar disponibles para asuntos urgentes fuera del horario de [place], no estamos disponibles las 24 horas del da, los 7 [address]  [address] de la [place].   Si tiene un problema urgente y no puede comunicarse con nosotros, puede optar por buscar atencin mdica  en el consultorio de su doctor(a), en una clnica privada, en un centro de atencin urgente o en una sala de emergencias.  Si tiene Engineer, drilling, por favor llame inmediatamente al 911 o vaya a la sala de emergencias.  Nmeros de bper  - Dr. Gwen Pounds: [phone]  - Dra. Roseanne Reno: [phone]  - Dr. Katrinka Blazing: [phone]   En caso de inclemencias del tiempo, por favor llame a Lacy Duverney principal al [phone] para una actualizacin sobre el [place] de cualquier retraso o cierre.  Consejos para la medicacin en dermatologa: Por favor, guarde las cajas en las que vienen los medicamentos de uso tpico para ayudarle a seguir las instrucciones sobre dnde y cmo usarlos. Las farmacias generalmente imprimen las instrucciones del medicamento slo en las cajas y no directamente en los tubos del [place].   Si su medicamento es muy caro, por favor, pngase en contacto con Rolm Gala llamando al [phone] y presione la opcin 4 o envenos un mensaje a travs de Clinical cytogeneticist.   No podemos decirle cul ser su copago por los medicamentos por adelantado ya que esto es diferente dependiendo de la cobertura de su seguro. Sin embargo, es posible que podamos encontrar un medicamento sustituto a Audiological scientist un formulario para que el seguro cubra el medicamento que se considera necesario.   Si se requiere una autorizacin previa para que su compaa de seguros [place] su medicamento, por  favor permtanos de 1 a 2 das hbiles para completar [address]  Los precios de los medicamentos varan con frecuencia dependiendo del Environmental consultant de dnde se surte la receta y alguna farmacias pueden ofrecer precios ms baratos.  El sitio web www.goodrx.com tiene cupones para medicamentos de Health and safety inspector. Los precios aqu no tienen en cuenta lo que podra costar con la ayuda del seguro (puede ser ms barato con su seguro), pero el sitio web puede darle el precio si no utiliz Tourist information centre manager.  - Puede imprimir el cupn correspondiente y llevarlo con su receta a la farmacia.  - Tambin puede pasar por nuestra oficina durante el horario de atencin regular y Education officer, museum una tarjeta de cupones de GoodRx.  - Si necesita que su receta  se enve electrnicamente a una farmacia diferente, informe a nuestra oficina a travs de MyChart de [place] o por telfono llamando al [phone] y presione la opcin 4.

## 2023-11-25 ENCOUNTER — Encounter: Payer: Self-pay | Admitting: Dermatology

## 2023-11-26 ENCOUNTER — Other Ambulatory Visit: Payer: Self-pay

## 2023-11-26 MED ORDER — FLUOROURACIL 5 % EX CREA: TOPICAL_CREAM | CUTANEOUS | 0 refills | Status: DC

## 2023-11-29 DIAGNOSIS — I2510 Atherosclerotic heart disease of native coronary artery without angina pectoris: Secondary | ICD-10-CM | POA: Diagnosis not present

## 2023-11-29 DIAGNOSIS — Z9861 Coronary angioplasty status: Secondary | ICD-10-CM | POA: Diagnosis not present

## 2023-11-29 DIAGNOSIS — I10 Essential (primary) hypertension: Secondary | ICD-10-CM | POA: Diagnosis not present

## 2023-11-29 DIAGNOSIS — R001 Bradycardia, unspecified: Secondary | ICD-10-CM | POA: Diagnosis not present

## 2023-11-29 DIAGNOSIS — I6523 Occlusion and stenosis of bilateral carotid arteries: Secondary | ICD-10-CM | POA: Diagnosis not present

## 2023-11-29 DIAGNOSIS — E785 Hyperlipidemia, unspecified: Secondary | ICD-10-CM | POA: Diagnosis not present

## 2024-01-07 ENCOUNTER — Other Ambulatory Visit: Payer: Self-pay | Admitting: Internal Medicine

## 2024-01-07 DIAGNOSIS — Z1231 Encounter for screening mammogram for malignant neoplasm of breast: Secondary | ICD-10-CM

## 2024-01-20 ENCOUNTER — Ambulatory Visit
Admission: RE | Admit: 2024-01-20 | Discharge: 2024-01-20 | Disposition: A | Discharge status: Home / Self Care | Source: Ambulatory Visit | Attending: Internal Medicine | Admitting: Internal Medicine

## 2024-01-20 DIAGNOSIS — Z1231 Encounter for screening mammogram for malignant neoplasm of breast: Secondary | ICD-10-CM | POA: Insufficient documentation

## 2024-02-07 DIAGNOSIS — I10 Essential (primary) hypertension: Secondary | ICD-10-CM | POA: Diagnosis not present

## 2024-02-07 DIAGNOSIS — F4323 Adjustment disorder with mixed anxiety and depressed mood: Secondary | ICD-10-CM | POA: Diagnosis not present

## 2024-02-20 DIAGNOSIS — F4323 Adjustment disorder with mixed anxiety and depressed mood: Secondary | ICD-10-CM | POA: Diagnosis not present

## 2024-03-04 ENCOUNTER — Emergency Department

## 2024-03-04 ENCOUNTER — Emergency Department
Admission: EM | Admit: 2024-03-04 | Discharge: 2024-03-04 | Disposition: A | Discharge status: Home / Self Care | Attending: Emergency Medicine | Admitting: Emergency Medicine

## 2024-03-04 ENCOUNTER — Other Ambulatory Visit: Payer: Self-pay

## 2024-03-04 DIAGNOSIS — I700 Atherosclerosis of aorta: Secondary | ICD-10-CM | POA: Diagnosis not present

## 2024-03-04 DIAGNOSIS — Z7982 Long term (current) use of aspirin: Secondary | ICD-10-CM | POA: Diagnosis not present

## 2024-03-04 DIAGNOSIS — Z79899 Other long term (current) drug therapy: Secondary | ICD-10-CM | POA: Insufficient documentation

## 2024-03-04 DIAGNOSIS — R519 Headache, unspecified: Secondary | ICD-10-CM | POA: Diagnosis not present

## 2024-03-04 DIAGNOSIS — I6523 Occlusion and stenosis of bilateral carotid arteries: Secondary | ICD-10-CM | POA: Diagnosis not present

## 2024-03-04 DIAGNOSIS — Z85828 Personal history of other malignant neoplasm of skin: Secondary | ICD-10-CM | POA: Diagnosis not present

## 2024-03-04 DIAGNOSIS — U071 COVID-19: Secondary | ICD-10-CM | POA: Diagnosis not present

## 2024-03-04 DIAGNOSIS — I2510 Atherosclerotic heart disease of native coronary artery without angina pectoris: Secondary | ICD-10-CM | POA: Diagnosis not present

## 2024-03-04 DIAGNOSIS — I10 Essential (primary) hypertension: Secondary | ICD-10-CM | POA: Diagnosis not present

## 2024-03-04 LAB — BASIC METABOLIC PANEL WITH GFR
Anion gap: 10 (ref 5–15)
BUN: 12 mg/dL (ref 8–23)
CO2: 20 mmol/L — ABNORMAL LOW (ref 22–32)
Calcium: 9 mg/dL (ref 8.9–10.3)
Chloride: 102 mmol/L (ref 98–111)
Creatinine, Ser: 0.82 mg/dL (ref 0.44–1.00)
GFR, Estimated: >60 mL/min (ref >60)
Glucose, Bld: 122 mg/dL — ABNORMAL HIGH (ref 70–99)
Potassium: 3.8 mmol/L (ref 3.5–5.1)
Sodium: 132 mmol/L — ABNORMAL LOW (ref 135–145)

## 2024-03-04 LAB — URINALYSIS, W/ REFLEX TO CULTURE (INFECTION SUSPECTED)
Bacteria, UA: NONE SEEN
Bilirubin Urine: NEGATIVE
Glucose, UA: NEGATIVE mg/dL
Hgb urine dipstick: NEGATIVE
Ketones, ur: NEGATIVE mg/dL
Leukocytes,Ua: NEGATIVE
Nitrite: NEGATIVE
Protein, ur: NEGATIVE mg/dL
Specific Gravity, Urine: 1.033 — ABNORMAL HIGH (ref 1.005–1.030)
pH: 7 (ref 5.0–8.0)

## 2024-03-04 LAB — CBC WITH DIFFERENTIAL/PLATELET
Abs Immature Granulocytes: 0.02 K/uL (ref 0.00–0.07)
Basophils Absolute: 0 K/uL (ref 0.0–0.1)
Basophils Relative: 1 %
Eosinophils Absolute: 0 K/uL (ref 0.0–0.5)
Eosinophils Relative: 0 %
HCT: 31.5 % — ABNORMAL LOW (ref 36.0–46.0)
Hemoglobin: 11.2 g/dL — ABNORMAL LOW (ref 12.0–15.0)
Immature Granulocytes: 0 %
Lymphocytes Relative: 12 %
Lymphs Abs: 0.6 K/uL — ABNORMAL LOW (ref 0.7–4.0)
MCH: 30.2 pg (ref 26.0–34.0)
MCHC: 35.6 g/dL (ref 30.0–36.0)
MCV: 84.9 fL (ref 80.0–100.0)
Monocytes Absolute: 0.7 K/uL (ref 0.1–1.0)
Monocytes Relative: 15 %
Neutro Abs: 3.3 K/uL (ref 1.7–7.7)
Neutrophils Relative %: 72 %
Platelets: 170 K/uL (ref 150–400)
RBC: 3.71 MIL/uL — ABNORMAL LOW (ref 3.87–5.11)
RDW: 13.2 % (ref 11.5–15.5)
WBC: 4.6 K/uL (ref 4.0–10.5)
nRBC: 0 % (ref 0.0–0.2)

## 2024-03-04 LAB — RESP PANEL BY RT-PCR (RSV, FLU A&B, COVID)  RVPGX2
Influenza A by PCR: NEGATIVE
Influenza B by PCR: NEGATIVE
Resp Syncytial Virus by PCR: NEGATIVE
SARS Coronavirus 2 by RT PCR: POSITIVE — AB

## 2024-03-04 LAB — PROTIME-INR
INR: 1 (ref 0.8–1.2)
Prothrombin Time: 13.8 s (ref 11.4–15.2)

## 2024-03-04 MED ORDER — PROCHLORPERAZINE EDISYLATE 10 MG/2ML IJ SOLN
5.0000 mg | Freq: Once | INTRAMUSCULAR | Status: AC
  Administered 2024-03-04: 5 mg via INTRAVENOUS
  Filled 2024-03-04: qty 2

## 2024-03-04 MED ORDER — SODIUM CHLORIDE 0.9 % IV BOLUS (SEPSIS)
1000.0000 mL | Freq: Once | INTRAVENOUS | Status: AC
  Administered 2024-03-04: 1000 mL via INTRAVENOUS

## 2024-03-04 MED ORDER — KETOROLAC TROMETHAMINE 30 MG/ML IJ SOLN
15.0000 mg | Freq: Once | INTRAMUSCULAR | Status: AC
  Administered 2024-03-04: 15 mg via INTRAVENOUS
  Filled 2024-03-04: qty 1

## 2024-03-04 MED ORDER — NIRMATRELVIR/RITONAVIR (PAXLOVID)TABLET: 3.0000 | ORAL_TABLET | Freq: Two times a day (BID) | ORAL | 0 refills | Status: AC

## 2024-03-04 MED ORDER — IOHEXOL 350 MG/ML SOLN
75.0000 mL | Freq: Once | INTRAVENOUS | Status: AC | PRN
  Administered 2024-03-04: 75 mL via INTRAVENOUS

## 2024-03-04 MED ORDER — ACETAMINOPHEN 500 MG PO TABS
1000.0000 mg | ORAL_TABLET | Freq: Once | ORAL | Status: AC
  Administered 2024-03-04: 1000 mg via ORAL
  Filled 2024-03-04: qty 2

## 2024-03-04 NOTE — ED Triage Notes (Signed)
 Pt reports she woke up yesterday morning with a headache, pt reports it has continued into tonight with no relief at home. Pt reports she checked her BP at home tonight and noticed it was elevated at 150/75. Pt denies numbness or weakness in extremities, speech is clear.

## 2024-03-04 NOTE — ED Notes (Signed)
 Pt given DC instructions and follow up care. Pt verbalized understanding. Pt ambulatory from Ed without difficulty. NAD noted at time of departure.

## 2024-03-04 NOTE — Discharge Instructions (Signed)
 You have been diagnosed with COVID 19.  This is a virus that can cause many different symptoms and can be extremely contagious.    You may be eligible for outpatient antiviral treatments for COVID 19 such as Paxlovid , Molnupiravir if you are within the first 5 days of symptoms. You do not need antibiotics for COVID 19 since it is a virus.  You may use over the counter medications to help manage your symptoms at home.    You may alternate Tylenol  1000 mg every 6 hours as needed for pain, fever.  Do not take more than 4000 mg of Tylenol  (acetaminophen ) in a 24 hour period.  Please rest and drink plenty of fluids.  You will need to quarantine from others for five days (first day of symptoms is DAY ZERO).  If your symptoms are improving or resolved at the end of this time frame, you may come out of quarantine but will need to wear a well fitted mask when around others for the next 5 days.   The best way to protect yourself and others from COVID 19 and potential long term complications is to be vaccinated and receive boosters as recommended by the [facility] and your primary care provider.  If you develop shortness of breath, blue lips or blue fingertips, vomiting that does not stop, chest pain, confusion, become severely weak or feel you may pass out, please return to the closest emergency department.

## 2024-03-04 NOTE — ED Provider Notes (Signed)
 [facility] Provider Note    Event Date/Time   First MD Initiated Contact with Patient 03/04/24 0132     (approximate)   History   Headache   HPI  Makayla Burke is a 78 y.o. female with history of hypertension, hyperlipidemia who presents to the emergency department with complaints of generalized headache that started yesterday morning.  States it woke her from sleep.  Patient is unable to tell me if this came on suddenly or was gradual in onset.  She describes it as severe.  No recent head injury.  On aspirin but no anticoagulant.  No numbness, tingling or weakness.  No fevers, cough, vomiting or diarrhea.  No history of migraines or chronic headaches.  States she has a brain tumor in the front of her head that is being followed by x-rays but is unable to tell me when she last had any imaging or who is following this.   History provided by patient, family.  Limited as patient and family are poor historians.    Past Medical History:  Diagnosis Date   Anxiety    Basal cell carcinoma 10/02/2021   Left upper back paraspinal - [facility]   Cancer (HCC)    skin   Coronary artery disease    Depression    Hematoma    Hypercholesteremia    Hypertension    Myocardial infarction (HCC)    2006   Squamous cell carcinoma of skin 07/2018   left upper back   Squamous cell carcinoma of skin 10/02/2021   Right mid post calf - [facility]   Squamous cell carcinoma of skin 08/27/2022   L lat calf, [facility]    Past Surgical History:  Procedure Laterality Date   CATARACT EXTRACTION W/PHACO Right 12/20/2014   Procedure: CATARACT EXTRACTION PHACO AND INTRAOCULAR LENS PLACEMENT (IOC);  Surgeon: Steven Dingeldein, MD;  Location: [facility] ORS;  Service: Ophthalmology;  Laterality: Right;  US  00:50 AP% 22.2 CDE 21.36   CATARACT EXTRACTION W/PHACO Left 03/28/2015   Procedure: CATARACT EXTRACTION PHACO AND INTRAOCULAR LENS PLACEMENT (IOC);  Surgeon: Steven Dingeldein, MD;  Location: [facility]  ORS;  Service: Ophthalmology;  Laterality: Left;  US  0 :43.8 AP 20.7% CDE16.65 casette lot #[id number]   COLONOSCOPY     COLONOSCOPY WITH PROPOFOL  N/A 02/07/2018   Procedure: COLONOSCOPY WITH PROPOFOL ;  Surgeon: Viktoria Lamar DASEN, MD;  Location: [facility] ENDOSCOPY;  Service: Endoscopy;  Laterality: N/A;   COLONOSCOPY WITH PROPOFOL  N/A 08/01/2023   Procedure: COLONOSCOPY WITH PROPOFOL ;  Surgeon: Onita Elspeth Sharper, DO;  Location: [facility] ENDOSCOPY;  Service: Gastroenterology;  Laterality: N/A;   CORONARY ANGIOPLASTY     stents 2006   EYE SURGERY     FINGER SURGERY     POLYPECTOMY  08/01/2023   Procedure: POLYPECTOMY;  Surgeon: Onita Elspeth Sharper, DO;  Location: [facility] ENDOSCOPY;  Service: Gastroenterology;;    MEDICATIONS:  Prior to Admission medications   Medication Sig Start Date End Date Taking? Authorizing Provider  aspirin EC 81 MG tablet Take 81 mg by mouth daily.    [provider]  fluorouracil  (EFUDEX ) 5 % cream Starting after July 4th apply to the forehead and temples BID x 7 days. 11/26/23   Hester Alm BROCKS, MD  hydrochlorothiazide (MICROZIDE) 12.5 MG capsule Take 12.5 mg by mouth daily.    [provider]  metoprolol succinate (TOPROL-XL) 25 MG 24 hr tablet Take 25 mg by mouth daily.    [provider]  PARoxetine (PAXIL) 10 MG tablet Take  10 mg by mouth daily.    [provider]  ramipril (ALTACE) 10 MG capsule Take 10 mg by mouth daily.    [provider]  simvastatin (ZOCOR) 40 MG tablet Take 40 mg by mouth daily.    [provider]    Physical Exam   Triage Vital Signs: ED Triage Vitals [03/04/24 0128]  Encounter Vitals Group     BP 139/81     Girls Systolic BP Percentile      Girls Diastolic BP Percentile      Boys Systolic BP Percentile      Boys Diastolic BP Percentile      Pulse Rate 72     Resp 18     Temp 98.4 F (36.9 C)     Temp src      SpO2 97 %     Weight 135 lb (61.2 kg)     Height 5' 3 (1.6 m)      Head Circumference      Peak Flow      Pain Score 10     Pain Loc      Pain Education      Exclude from Growth Chart     Most recent vital signs: Vitals:   03/04/24 0128  BP: 139/81  Pulse: 72  Resp: 18  Temp: 98.4 F (36.9 C)  SpO2: 97%    CONSTITUTIONAL: Alert, responds appropriately to questions. Well-appearing; well-nourished, elderly, appears in no distress HEAD: Normocephalic, atraumatic EYES: Conjunctivae clear, pupils appear equal, sclera nonicteric ENT: normal nose; moist mucous membranes NECK: Supple, normal ROM, no meningismus CARD: RRR; S1 and S2 appreciated RESP: Normal chest excursion without splinting or tachypnea; breath sounds clear and equal bilaterally; no wheezes, no rhonchi, no rales, no hypoxia or respiratory distress, speaking full sentences ABD/GI: Non-distended; soft, non-tender, no rebound, no guarding, no peritoneal signs BACK: The back appears normal EXT: Normal ROM in all joints; no deformity noted, no edema SKIN: Normal color for age and race; warm; no rash on exposed skin NEURO: Moves all extremities equally, normal speech, normal sensation diffusely, no facial asymmetry PSYCH: The patient's mood and manner are appropriate.   ED Results / Procedures / Treatments   LABS: (all labs ordered are listed, but only abnormal results are displayed) Labs Reviewed  RESP PANEL BY RT-PCR (RSV, FLU A&B, COVID)  RVPGX2 - Abnormal; Notable for the following components:      Result Value   SARS Coronavirus 2 by RT PCR POSITIVE (*)    All other components within normal limits  CBC WITH DIFFERENTIAL/PLATELET - Abnormal; Notable for the following components:   RBC 3.71 (*)    Hemoglobin 11.2 (*)    HCT 31.5 (*)    Lymphs Abs 0.6 (*)    All other components within normal limits  BASIC METABOLIC PANEL WITH GFR - Abnormal; Notable for the following components:   Sodium 132 (*)    CO2 20 (*)    Glucose, Bld 122 (*)    All other components within normal  limits  URINALYSIS, W/ REFLEX TO CULTURE (INFECTION SUSPECTED) - Abnormal; Notable for the following components:   Color, Urine COLORLESS (*)    APPearance CLEAR (*)    Specific Gravity, Urine 1.033 (*)    All other components within normal limits  PROTIME-INR     EKG:   RADIOLOGY: My personal review and interpretation of imaging: CTA head and neck showed no acute abnormality.  No aneurysm.  No bleeding.  I have personally reviewed all radiology reports.   CT ANGIO HEAD NECK W WO CM Result Date: 03/04/2024 CLINICAL DATA:  Initial evaluation for acute headache. EXAM: CT ANGIOGRAPHY HEAD AND NECK WITH AND WITHOUT CONTRAST TECHNIQUE: Multidetector CT imaging of the head and neck was performed using the standard protocol during bolus administration of intravenous contrast. Multiplanar CT image reconstructions and MIPs were obtained to evaluate the vascular anatomy. Carotid stenosis measurements (when applicable) are obtained utilizing NASCET criteria, using the distal internal carotid diameter as the denominator. RADIATION DOSE REDUCTION: This exam was performed according to the departmental dose-optimization program which includes automated exposure control, adjustment of the mA and/or kV according to patient size and/or use of iterative reconstruction technique. CONTRAST:  75mL OMNIPAQUE  IOHEXOL  350 MG/ML SOLN COMPARISON:  None Available. FINDINGS: CT HEAD FINDINGS Brain: Cerebral volume within normal limits for patient age. No acute intracranial hemorrhage. No acute large vessel territory infarct. No mass lesion, midline shift, or mass effect. Ventricles are normal in size without hydrocephalus. No extra-axial fluid collection. Vascular: No abnormal hyperdense vessel. Skull: Scalp soft tissues demonstrate no acute abnormality. Calvarium intact. Sinuses/Orbits: Globes and orbital soft tissues within normal limits. Visualized paranasal sinuses are largely clear. No significant mastoid effusion. Mild  mucosal thickening present about the ethmoidal air cells and left maxillary sinus. Paranasal sinuses are otherwise largely clear. Small right mastoid effusion noted. CTA NECK FINDINGS Aortic arch: Aortic arch within normal limits for caliber with standard branch pattern. Moderate aortic atherosclerosis. No significant stenosis about the origin the great vessels. Right carotid system: Right common and internal carotid arteries are patent without dissection. Mild atheromatous change about the right carotid bulb without hemodynamically significant stenosis. Multifocal irregularity about the cervical right ICA, suggestive of FMD. Left carotid system: Left common and internal carotid arteries are patent without dissection. Mild atheromatous change about the left carotid bulb without hemodynamically significant stenosis. Multifocal irregularity about the cervical left ICA the left suggestive of FMD. Vertebral arteries: Both vertebral arteries arise from subclavian arteries. Left vertebral artery dominant. Vertebral arteries patent without stenosis or dissection. Skeleton: No worrisome osseous lesions. Moderate spondylosis present at C5-6 and C6-7. Other neck: No other acute finding. Upper chest: No other acute finding. Review of the MIP images confirms the above findings CTA HEAD FINDINGS Anterior circulation: Both internal carotid arteries are patent to the siphons without significant stenosis. A1 segments patent bilaterally. Normal anterior communicating artery complex. Anterior cerebral arteries patent without stenosis. No M1 stenosis or occlusion. Distal MCA branches perfused and symmetric. Posterior circulation: Left V4 segment strongly dominant and widely patent without stenosis. Left PICA patent. Right vertebral artery diminutive and terminates in PICA. Right PICA patent as well. Basilar patent without stenosis. Superior cerebellar and posterior cerebral arteries patent bilaterally. Venous sinuses: Patent allowing  for timing the contrast bolus. Anatomic variants: As above.  No aneurysm. Review of the MIP images confirms the above findings IMPRESSION: 1. Negative CTA of the head and neck. No large vessel occlusion or other emergent finding. No aneurysm. 2. Mild atheromatous change about the carotid bifurcations without hemodynamically significant stenosis. 3. Multifocal irregularity about the cervical ICAs bilaterally, suggestive of FMD. 4. No other acute intracranial abnormality. 5.  Aortic Atherosclerosis (ICD10-I70.0). Electronically Signed   By: Morene Hoard M.D.   On: 03/04/2024 02:44     PROCEDURES:  Critical Care performed: No     Procedures    IMPRESSION / MDM / ASSESSMENT AND PLAN / ED COURSE  I reviewed the triage vital  signs and the nursing notes.    Patient here for severe headache.  No focal neurodeficits.   DIFFERENTIAL DIAGNOSIS (includes but not limited to):   Migraine, tension headache, intracranial hemorrhage, ruptured aneurysm, mass, stroke, cavernous sinus thrombosis   Patient's presentation is most consistent with acute presentation with potential threat to life or bodily function.   PLAN: Will obtain labs, CTA of the head and neck.  Will give IV fluids, Compazine .   MEDICATIONS GIVEN IN ED: Medications  sodium chloride  0.9 % bolus 1,000 mL (1,000 mLs Intravenous New Bag/Given 03/04/24 0204)  prochlorperazine  (COMPAZINE ) injection 5 mg (5 mg Intravenous Given 03/04/24 0204)  iohexol  (OMNIPAQUE ) 350 MG/ML injection 75 mL (75 mLs Intravenous Contrast Given 03/04/24 0212)  acetaminophen  (TYLENOL ) tablet 1,000 mg (1,000 mg Oral Given 03/04/24 0323)  ketorolac  (TORADOL ) 30 MG/ML injection 15 mg (15 mg Intravenous Given 03/04/24 0323)     ED COURSE: CTA head and neck reviewed and interpreted by myself and the radiologist and shows no stenosis, aneurysm, bleeding.  Patient has tested positive for COVID-19 which is likely the cause of her headache.  No meningismus on  exam.  Reports no significant improvement after Compazine  but is smiling, laughing in the room.  Will give Tylenol , Toradol  and continue her IV fluids but anticipate discharge home.  Given age and comorbidities, will discharge with Paxlovid .  Recommended Tylenol  at home as needed and increase fluid intake.  No chest pain, shortness of breath, hypoxia here.  No encephalopathy.  No indication for admission as no obvious complications from COVID-19 at this time.  Labs show normal hemoglobin, electrolytes, glucose, creatinine.   At this time, I do not feel there is any life-threatening condition present. I reviewed all nursing notes, vitals, pertinent previous records.  All lab and urine results, EKGs, imaging ordered have been independently reviewed and interpreted by myself.  I reviewed all available radiology reports from any imaging ordered this visit.  Based on my assessment, I feel the patient is safe to be discharged home without further emergent workup and can continue workup as an outpatient as needed. Discussed all findings, treatment plan as well as usual and customary return precautions.  They verbalize understanding and are comfortable with this plan.  Outpatient follow-up has been provided as needed.  All questions have been answered.    CONSULTS:  none   OUTSIDE RECORDS REVIEWED: Reviewed last PCP note on 02/20/2024.  I am not able to find any recent notes regarding an intracranial mass or any prior head imaging.       FINAL CLINICAL IMPRESSION(S) / ED DIAGNOSES   Final diagnoses:  Bad headache  COVID-19     Rx / DC Orders   ED Discharge Orders          Ordered    nirmatrelvir /ritonavir  (PAXLOVID ) 20 x 150 MG & 10 x 100MG  TABS  2 times daily        03/04/24 0316             Note:  This document was prepared using Dragon voice recognition software and may include unintentional dictation errors.   Benno Brensinger, Josette SAILOR, DO 03/04/24 [phone]

## 2024-03-17 DIAGNOSIS — H93291 Other abnormal auditory perceptions, right ear: Secondary | ICD-10-CM | POA: Diagnosis not present

## 2024-03-17 DIAGNOSIS — H6121 Impacted cerumen, right ear: Secondary | ICD-10-CM | POA: Diagnosis not present

## 2024-05-20 DIAGNOSIS — I10 Essential (primary) hypertension: Secondary | ICD-10-CM | POA: Diagnosis not present

## 2024-05-21 ENCOUNTER — Ambulatory Visit: Admitting: Dermatology

## 2024-05-21 ENCOUNTER — Encounter: Payer: Self-pay | Admitting: Dermatology

## 2024-05-21 DIAGNOSIS — C44212 Basal cell carcinoma of skin of right ear and external auricular canal: Secondary | ICD-10-CM | POA: Diagnosis not present

## 2024-05-21 DIAGNOSIS — L578 Other skin changes due to chronic exposure to nonionizing radiation: Secondary | ICD-10-CM

## 2024-05-21 DIAGNOSIS — W908XXA Exposure to other nonionizing radiation, initial encounter: Secondary | ICD-10-CM

## 2024-05-21 DIAGNOSIS — C44712 Basal cell carcinoma of skin of right lower limb, including hip: Secondary | ICD-10-CM

## 2024-05-21 DIAGNOSIS — D489 Neoplasm of uncertain behavior, unspecified: Secondary | ICD-10-CM

## 2024-05-21 DIAGNOSIS — L570 Actinic keratosis: Secondary | ICD-10-CM | POA: Diagnosis not present

## 2024-05-21 NOTE — Patient Instructions (Addendum)
 Electrodesiccation and Curettage ("Scrape and Burn") Wound Care Instructions  Leave the original bandage on for 24 hours if possible.  If the bandage becomes soaked or soiled before that time, it is OK to remove it and examine the wound.  A small amount of post-operative bleeding is normal.  If excessive bleeding occurs, remove the bandage, place gauze over the site and apply continuous pressure (no peeking) over the area for 30 minutes. If this does not work, please call our clinic as soon as possible or page your doctor if it is after hours.   Once a day, cleanse the wound with soap and water. It is fine to shower. If a thick crust develops you may use a Q-tip dipped into dilute hydrogen peroxide (mix 1:1 with water) to dissolve it.  Hydrogen peroxide can slow the healing process, so use it only as needed.    After washing, apply petroleum jelly (Vaseline) or an antibiotic ointment if your doctor prescribed one for you, followed by a bandage.    For best healing, the wound should be covered with a layer of ointment at all times. If you are not able to keep the area covered with a bandage to hold the ointment in place, this may mean re-applying the ointment several times a day.  Continue this wound care until the wound has healed and is no longer open. It may take several weeks for the wound to heal and close.  Itching and mild discomfort is normal during the healing process.  If you have any discomfort, you can take Tylenol  (acetaminophen ) or ibuprofen as directed on the bottle. (Please do not take these if you have an allergy to them or cannot take them for another reason).  Some redness, tenderness and white or yellow material in the wound is normal healing.  If the area becomes very sore and red, or develops a thick yellow-green material (pus), it may be infected; please notify us .    Wound healing continues for up to one year following surgery. It is not unusual to experience pain in the scar  from time to time during the interval.  If the pain becomes severe or the scar thickens, you should notify the office.    A slight amount of redness in a scar is expected for the first six months.  After six months, the redness will fade and the scar will soften and fade.  The color difference becomes less noticeable with time.  If there are any problems, return for a post-op surgery check at your earliest convenience.  To improve the appearance of the scar, you can use silicone scar gel, cream, or sheets (such as Mederma or Serica) every night for up to one year. These are available over the counter (without a prescription).  Please call our office at [phone] for any questions or concerns.    Biopsy Wound Care Instructions  Leave the original bandage on for 24 hours if possible.  If the bandage becomes soaked or soiled before that time, it is OK to remove it and examine the wound.  A small amount of post-operative bleeding is normal.  If excessive bleeding occurs, remove the bandage, place gauze over the site and apply continuous pressure (no peeking) over the area for 30 minutes. If this does not work, please call our clinic as soon as possible or page your doctor if it is after hours.   Once a day, cleanse the wound with soap and water. It is fine to  shower. If a thick crust develops you may use a Q-tip dipped into dilute hydrogen peroxide (mix 1:1 with water) to dissolve it.  Hydrogen peroxide can slow the healing process, so use it only as needed.    After washing, apply petroleum jelly (Vaseline) or an antibiotic ointment if your doctor prescribed one for you, followed by a bandage.    For best healing, the wound should be covered with a layer of ointment at all times. If you are not able to keep the area covered with a bandage to hold the ointment in place, this may mean re-applying the ointment several times a day.  Continue this wound care until the wound has healed and is no longer  open.   Itching and mild discomfort is normal during the healing process. However, if you develop pain or severe itching, please call our office.   If you have any discomfort, you can take Tylenol  (acetaminophen ) or ibuprofen as directed on the bottle. (Please do not take these if you have an allergy to them or cannot take them for another reason).  Some redness, tenderness and white or yellow material in the wound is normal healing.  If the area becomes very sore and red, or develops a thick yellow-green material (pus), it may be infected; please notify us .    If you have stitches, return to clinic as directed to have the stitches removed. You will continue wound care for 2-3 days after the stitches are removed.   Wound healing continues for up to one year following surgery. It is not unusual to experience pain in the scar from time to time during the interval.  If the pain becomes severe or the scar thickens, you should notify the office.    A slight amount of redness in a scar is expected for the first six months.  After six months, the redness will fade and the scar will soften and fade.  The color difference becomes less noticeable with time.  If there are any problems, return for a post-op surgery check at your earliest convenience.  To improve the appearance of the scar, you can use silicone scar gel, cream, or sheets (such as Mederma or Serica) every night for up to one year. These are available over the counter (without a prescription).  Please call our office at [phone] for any questions or concerns.      Actinic keratoses are precancerous spots that appear secondary to cumulative UV radiation exposure/sun exposure over time. They are chronic with expected duration over 1 year. A portion of actinic keratoses will progress to squamous cell carcinoma of the skin. It is not possible to reliably predict which spots will progress to skin cancer and so treatment is recommended to  prevent development of skin cancer.  Recommend daily broad spectrum sunscreen SPF 30+ to sun-exposed areas, reapply every 2 hours as needed.  Recommend staying in the shade or wearing long sleeves, sun glasses (UVA+UVB protection) and wide brim hats (4-inch brim around the entire circumference of the hat). Call for new or changing lesions.    Cryotherapy Aftercare  Wash gently with soap and water everyday.   Apply Vaseline and Band-Aid daily until healed.     Due to recent changes in healthcare laws, you may see results of your pathology and/or laboratory studies on MyChart before the doctors have had a chance to review them. We understand that in some cases there may be results that are confusing or concerning to you. Please  understand that not all results are received at the same time and often the doctors may need to interpret multiple results in order to provide you with the best plan of care or course of treatment. Therefore, we ask that you please give us  2 business days to thoroughly review all your results before contacting the office for clarification. Should we see a critical lab result, you will be contacted sooner.   If You Need Anything After Your Visit  If you have any questions or concerns for your doctor, please call our main line at [phone] and press option 4 to reach your doctor's medical assistant. If no one answers, please leave a voicemail as directed and we will return your call as soon as possible. Messages left after 4 pm will be answered the following business day.   You may also send us  a message via MyChart. We typically respond to MyChart messages within 1-2 business days.  For prescription refills, please ask your pharmacy to contact our office. Our fax number is [phone].  If you have an urgent issue when the clinic is closed that cannot wait until the next business day, you can page your doctor at the number below.    Please note that while we do our  best to be available for urgent issues outside of office hours, we are not available 24/7.   If you have an urgent issue and are unable to reach us , you may choose to seek medical care at your doctor's office, retail clinic, urgent care center, or emergency room.  If you have a medical emergency, please immediately call 911 or go to the emergency department.  Pager Numbers  - Dr. Hester: [phone]  - Dr. Jackquline: [phone]  - Dr. Claudene: [phone]   - Dr. Raymund: [phone]  In the event of inclement weather, please call our main line at [phone] for an update on the status of any delays or closures.  Dermatology Medication Tips: Please keep the boxes that topical medications come in in order to help keep track of the instructions about where and how to use these. Pharmacies typically print the medication instructions only on the boxes and not directly on the medication tubes.   If your medication is too expensive, please contact our office at [phone] option 4 or send us  a message through MyChart.   We are unable to tell what your co-pay for medications will be in advance as this is different depending on your insurance coverage. However, we may be able to find a substitute medication at lower cost or fill out paperwork to get insurance to cover a needed medication.   If a prior authorization is required to get your medication covered by your insurance company, please allow us  1-2 business days to complete this process.  Drug prices often vary depending on where the prescription is filled and some pharmacies may offer cheaper prices.  The website www.goodrx.com contains coupons for medications through different pharmacies. The prices here do not account for what the cost may be with help from insurance (it may be cheaper with your insurance), but the website can give you the price if you did not use any insurance.  - You can print the associated coupon and take it  with your prescription to the pharmacy.  - You may also stop by our office during regular business hours and pick up a GoodRx coupon card.  - If you need your prescription sent electronically to a different pharmacy, notify our  office through [facility] or by phone at [phone] option 4.     Si Usted Necesita Algo Despus de Su Visita  Tambin puede enviarnos un mensaje a travs de Clinical Cytogeneticist. Por lo general respondemos a los mensajes de MyChart en el transcurso de 1 a 2 das hbiles.  Para renovar recetas, por favor pida a su farmacia que se ponga en contacto con nuestra oficina. Randi lakes de fax es [place] [phone].  Si tiene un asunto urgente cuando la clnica est cerrada y que no puede esperar hasta el siguiente da hbil, puede llamar/localizar a su doctor(a) al nmero que aparece a continuacin.   Por favor, tenga en cuenta que aunque hacemos todo lo posible para estar disponibles para asuntos urgentes fuera del horario de [place], no estamos disponibles las 24 horas del da, los 7 [address]  [address] de la [place].   Si tiene un problema urgente y no puede comunicarse con nosotros, puede optar por buscar atencin mdica  en el consultorio de su doctor(a), en una clnica privada, en un centro de atencin urgente o en una sala de emergencias.  Si tiene engineer, drilling, por favor llame inmediatamente al 911 o vaya a la sala de emergencias.  Nmeros de bper  - Dr. Hester: [phone]  - Dra. Jackquline: [phone]  - Dr. Claudene: [phone]  - Dra. Kitts: [phone]  En caso de inclemencias del [place], por favor llame a nuestra lnea principal al [phone] para una actualizacin sobre el estado de cualquier retraso o cierre.  Consejos para la medicacin en dermatologa: Por favor, guarde las cajas en las que vienen los medicamentos de uso tpico para ayudarle a seguir las instrucciones sobre dnde y cmo usarlos. Las farmacias generalmente imprimen las instrucciones  del medicamento slo en las cajas y no directamente en los tubos del [place].   Si su medicamento es muy caro, por favor, pngase en contacto con landry rieger llamando al [phone] y presione la opcin 4 o envenos un mensaje a travs de Clinical Cytogeneticist.   No podemos decirle cul ser su copago por los medicamentos por adelantado ya que esto es diferente dependiendo de la cobertura de su seguro. Sin embargo, es posible que podamos encontrar un medicamento sustituto a audiological scientist un formulario para que el seguro cubra el medicamento que se considera necesario.   Si se requiere una autorizacin previa para que su compaa de seguros cubra su medicamento, por favor permtanos de 1 a 2 das hbiles para completar este proceso.  Los precios de los medicamentos varan con frecuencia dependiendo del environmental consultant de dnde se surte la receta y alguna farmacias pueden ofrecer precios ms baratos.  El sitio web www.goodrx.com tiene cupones para medicamentos de health and safety inspector. Los precios aqu no tienen en cuenta lo que podra costar con la ayuda del seguro (puede ser ms barato con su seguro), pero el sitio web puede darle el precio si no utiliz tourist information centre manager.  - Puede imprimir el cupn correspondiente y llevarlo con su receta a la farmacia.  - Tambin puede pasar por nuestra oficina durante el horario de atencin regular y education officer, museum una tarjeta de cupones de GoodRx.  - Si necesita que su receta se enve electrnicamente a una farmacia diferente, informe a nuestra oficina a travs de MyChart de [place] o por telfono llamando al [phone] y presione la opcin 4.

## 2024-05-21 NOTE — Progress Notes (Unsigned)
 Follow-Up Visit   Subjective  Makayla Burke is a 78 y.o. female who presents for the following: 6 month ak check   Spot at right ear and bothers her when laying down   Patient reports a sore spot on back of right leg that will not go away and a spot on chest  Ak forehead temple nose lip Did not use 5 fu cream in July to temples and forehead    The patient has spots, moles and lesions to be evaluated, some may be new or changing and the patient may have concern these could be cancer.   The following portions of the chart were reviewed this encounter and updated as appropriate: medications, allergies, medical history  Review of Systems:  No other skin or systemic complaints except as noted in HPI or Assessment and Plan.  Objective  Well appearing patient in no apparent distress; mood and affect are within normal limits.   A focused examination was performed of the following areas: Face, right ear, right leg   Relevant exam findings are noted in the Assessment and Plan.  face x 5 (5) Erythematous thin papules/macules with gritty scale.  right ear inferior helix 1.2 cm x 0.7 cm crusted papule  right calf 1.1 cm crusted papule    Assessment & Plan   ACTINIC KERATOSIS (5) face x 5 (5) Actinic keratoses are precancerous spots that appear secondary to cumulative UV radiation exposure/sun exposure over time. They are chronic with expected duration over 1 year. A portion of actinic keratoses will progress to squamous cell carcinoma of the skin. It is not possible to reliably predict which spots will progress to skin cancer and so treatment is recommended to prevent development of skin cancer.  Recommend daily broad spectrum sunscreen SPF 30+ to sun-exposed areas, reapply every 2 hours as needed.  Recommend staying in the shade or wearing long sleeves, sun glasses (UVA+UVB protection) and wide brim hats (4-inch brim around the entire circumference of the hat). Call for new or  changing lesions. Destruction of lesion - face x 5 (5) Complexity: simple   Destruction method: cryotherapy   Informed consent: discussed and consent obtained   Timeout:  patient name, date of birth, surgical site, and procedure verified Lesion destroyed using liquid nitrogen: Yes   Region frozen until ice ball extended beyond lesion: Yes   Outcome: patient tolerated procedure well with no complications   Post-procedure details: wound care instructions given    NEOPLASM OF UNCERTAIN BEHAVIOR (2) right ear inferior helix Skin / nail biopsy Type of biopsy: tangential   Informed consent: discussed and consent obtained   Timeout: patient name, date of birth, surgical site, and procedure verified   Procedure prep:  Patient was prepped and draped in usual sterile fashion Prep type:  Isopropyl alcohol  Anesthesia: the lesion was anesthetized in a standard fashion   Anesthetic:  1% lidocaine  w/ epinephrine  1-100,000 buffered w/ 8.4% NaHCO3 Instrument used: flexible razor blade   Hemostasis achieved with: pressure, aluminum chloride and electrodesiccation   Outcome: patient tolerated procedure well   Post-procedure details: sterile dressing applied and wound care instructions given   Dressing type: petrolatum and bandage    Specimen 1 - Surgical pathology Differential Diagnosis: R/o CNCH vs ca   Check Margins: No right calf Epidermal / dermal shaving  Lesion diameter (cm):  1.1 Informed consent: discussed and consent obtained   Timeout: patient name, date of birth, surgical site, and procedure verified   Procedure prep:  Patient was prepped and draped in usual sterile fashion Prep type:  Isopropyl alcohol  Anesthesia: the lesion was anesthetized in a standard fashion   Anesthetic:  1% lidocaine  w/ epinephrine  1-100,000 buffered w/ 8.4% NaHCO3 Instrument used: flexible razor blade   Hemostasis achieved with: pressure, aluminum chloride and electrodesiccation   Outcome: patient  tolerated procedure well   Post-procedure details: sterile dressing applied and wound care instructions given   Dressing type: bandage and petrolatum    Destruction of lesion Complexity: extensive   Destruction method: electrodesiccation and curettage   Informed consent: discussed and consent obtained   Timeout:  patient name, date of birth, surgical site, and procedure verified Procedure prep:  Patient was prepped and draped in usual sterile fashion Prep type:  Isopropyl alcohol  Anesthesia: the lesion was anesthetized in a standard fashion   Anesthetic:  1% lidocaine  w/ epinephrine  1-100,000 buffered w/ 8.4% NaHCO3 Curettage performed in three different directions: Yes   Electrodesiccation performed over the curetted area: Yes   Lesion length (cm):  1.1 Lesion width (cm):  1.1 Margin per side (cm):  0.2 Final wound size (cm):  1.5 Hemostasis achieved with:  pressure, aluminum chloride and electrodesiccation Outcome: patient tolerated procedure well with no complications   Post-procedure details: sterile dressing applied and wound care instructions given   Dressing type: bandage and petrolatum    Specimen 2 - Surgical pathology Differential Diagnosis: R/o scc vs bcc ED&C done  Check Margins: yes ACTINIC SKIN DAMAGE   BASAL CELL CARCINOMA (BCC) OF RIGHT LOWER LEG   ACTINIC DAMAGE - chronic, secondary to cumulative UV radiation exposure/sun exposure over time - diffuse scaly erythematous macules with underlying dyspigmentation - Recommend daily broad spectrum sunscreen SPF 30+ to sun-exposed areas, reapply every 2 hours as needed.  - Recommend staying in the shade or wearing long sleeves, sun glasses (UVA+UVB protection) and wide brim hats (4-inch brim around the entire circumference of the hat). - Call for new or changing lesions.   Return for 6 - 8 tbse .  IEleanor Blush, CMA, am acting as scribe for Alm Rhyme, MD.   Documentation: I have reviewed the above  documentation for accuracy and completeness, and I agree with the above.  Alm Rhyme, MD

## 2024-05-25 LAB — SURGICAL PATHOLOGY

## 2024-05-26 ENCOUNTER — Encounter: Payer: Self-pay | Admitting: Dermatology

## 2024-05-26 ENCOUNTER — Ambulatory Visit: Payer: Self-pay | Admitting: Dermatology

## 2024-05-26 DIAGNOSIS — C44212 Basal cell carcinoma of skin of right ear and external auricular canal: Secondary | ICD-10-CM

## 2024-05-26 NOTE — Telephone Encounter (Addendum)
 Tried calling patient regarding results and to schedule Mohs. Did not answer. LM for patient to return call. ----- Message from Alm Rhyme sent at 05/26/2024 11:38 AM EST ----- FINAL DIAGNOSIS        1. Skin, right ear inferior helix :       BASAL CELL CARCINOMA, NODULAR PATTERN, ULCERATED        2. Skin, right claf :       BASAL CELL CARCINOMA, NODULAR PATTERN, ULCERATED   1- Cancer = BCC Schedule for MOHS 2- Cancer = BCC Already treated Recheck next visit ----- Message ----- From: Interface, Lab In Three Zero One Sent: 05/25/2024   8:25 PM EST To: Alm JAYSON Rhyme, MD

## 2024-05-26 NOTE — Telephone Encounter (Signed)
 Discussed biopsy results with patient, we will scheduled for Mohs   . Skin, right ear inferior helix :       BASAL CELL CARCINOMA, NODULAR PATTERN, ULCERATED         2. Skin, right claf :       BASAL CELL CARCINOMA, NODULAR PATTERN, ULCER

## 2024-05-26 NOTE — Addendum Note (Signed)
 Addended by: TANDA SETTER A on: 05/26/2024 06:13 PM   Modules accepted: Orders

## 2024-05-26 NOTE — Telephone Encounter (Signed)
-----   Message from Alm Rhyme sent at 05/26/2024 11:38 AM EST ----- FINAL DIAGNOSIS        1. Skin, right ear inferior helix :       BASAL CELL CARCINOMA, NODULAR PATTERN, ULCERATED        2. Skin, right claf :       BASAL CELL CARCINOMA, NODULAR PATTERN, ULCERATED   1- Cancer = BCC Schedule for MOHS 2- Cancer = BCC Already treated Recheck next visit ----- Message ----- From: Interface, Lab In Three Zero One Sent: 05/25/2024   8:25 PM EST To: Alm JAYSON Rhyme, MD

## 2024-05-27 ENCOUNTER — Other Ambulatory Visit: Payer: Self-pay

## 2024-05-27 ENCOUNTER — Other Ambulatory Visit (HOSPITAL_COMMUNITY): Payer: Self-pay

## 2024-05-27 DIAGNOSIS — Z9861 Coronary angioplasty status: Secondary | ICD-10-CM | POA: Diagnosis not present

## 2024-05-27 DIAGNOSIS — I2510 Atherosclerotic heart disease of native coronary artery without angina pectoris: Secondary | ICD-10-CM | POA: Diagnosis not present

## 2024-05-27 DIAGNOSIS — F4323 Adjustment disorder with mixed anxiety and depressed mood: Secondary | ICD-10-CM | POA: Diagnosis not present

## 2024-05-27 DIAGNOSIS — E785 Hyperlipidemia, unspecified: Secondary | ICD-10-CM | POA: Diagnosis not present

## 2024-05-27 DIAGNOSIS — I6523 Occlusion and stenosis of bilateral carotid arteries: Secondary | ICD-10-CM | POA: Diagnosis not present

## 2024-05-27 MED ORDER — ROSUVASTATIN CALCIUM 40 MG PO TABS
40.0000 mg | ORAL_TABLET | Freq: Every day | ORAL | 3 refills | Status: AC
  Filled 2024-05-27 – 2024-06-01 (×2): qty 90, 90d supply, fill #0

## 2024-06-01 ENCOUNTER — Other Ambulatory Visit: Payer: Self-pay

## 2024-06-01 ENCOUNTER — Other Ambulatory Visit (HOSPITAL_COMMUNITY): Payer: Self-pay

## 2024-06-01 DIAGNOSIS — E785 Hyperlipidemia, unspecified: Secondary | ICD-10-CM | POA: Diagnosis not present

## 2024-06-01 DIAGNOSIS — R001 Bradycardia, unspecified: Secondary | ICD-10-CM | POA: Diagnosis not present

## 2024-06-01 DIAGNOSIS — I2510 Atherosclerotic heart disease of native coronary artery without angina pectoris: Secondary | ICD-10-CM | POA: Diagnosis not present

## 2024-06-01 DIAGNOSIS — Z9861 Coronary angioplasty status: Secondary | ICD-10-CM | POA: Diagnosis not present

## 2024-06-01 DIAGNOSIS — I10 Essential (primary) hypertension: Secondary | ICD-10-CM | POA: Diagnosis not present

## 2024-06-01 DIAGNOSIS — I6523 Occlusion and stenosis of bilateral carotid arteries: Secondary | ICD-10-CM | POA: Diagnosis not present

## 2024-06-01 MED ORDER — ROSUVASTATIN CALCIUM 40 MG PO TABS
40.0000 mg | ORAL_TABLET | Freq: Every day | ORAL | 3 refills | Status: AC
  Filled 2024-06-01: qty 90, 90d supply, fill #0

## 2024-06-01 MED ORDER — PAROXETINE HCL 10 MG PO TABS
10.0000 mg | ORAL_TABLET | Freq: Every day | ORAL | 9 refills | Status: AC
  Filled 2024-06-01 – 2024-06-03 (×2): qty 30, 30d supply, fill #0

## 2024-06-03 ENCOUNTER — Other Ambulatory Visit (HOSPITAL_COMMUNITY): Payer: Self-pay

## 2024-06-03 ENCOUNTER — Other Ambulatory Visit: Payer: Self-pay

## 2024-06-30 ENCOUNTER — Encounter: Payer: Self-pay | Admitting: Dermatology

## 2024-06-30 ENCOUNTER — Ambulatory Visit: Admitting: Dermatology

## 2024-06-30 ENCOUNTER — Other Ambulatory Visit (HOSPITAL_COMMUNITY): Payer: Self-pay

## 2024-06-30 VITALS — BP 141/76 | HR 56 | Temp 98.1°F

## 2024-06-30 DIAGNOSIS — L578 Other skin changes due to chronic exposure to nonionizing radiation: Secondary | ICD-10-CM | POA: Diagnosis not present

## 2024-06-30 DIAGNOSIS — C44212 Basal cell carcinoma of skin of right ear and external auricular canal: Secondary | ICD-10-CM

## 2024-06-30 DIAGNOSIS — L814 Other melanin hyperpigmentation: Secondary | ICD-10-CM | POA: Diagnosis not present

## 2024-06-30 DIAGNOSIS — C4491 Basal cell carcinoma of skin, unspecified: Secondary | ICD-10-CM

## 2024-06-30 MED ORDER — OXYCODONE HCL 5 MG PO TABS
5.0000 mg | ORAL_TABLET | Freq: Four times a day (QID) | ORAL | 0 refills | Status: AC | PRN
  Filled 2024-06-30 – 2024-07-01 (×2): qty 8, 2d supply, fill #0

## 2024-06-30 MED ORDER — GENTAMICIN SULFATE 0.1 % EX OINT
1.0000 | TOPICAL_OINTMENT | Freq: Three times a day (TID) | CUTANEOUS | 2 refills | Status: AC
  Filled 2024-06-30: qty 15, 30d supply, fill #0
  Filled 2024-07-01: qty 15, 5d supply, fill #0

## 2024-06-30 MED ORDER — MUPIROCIN 2 % EX OINT
1.0000 | TOPICAL_OINTMENT | Freq: Two times a day (BID) | CUTANEOUS | 2 refills | Status: AC
  Filled 2024-06-30: qty 22, 30d supply, fill #0
  Filled 2024-07-01: qty 22, 11d supply, fill #0

## 2024-06-30 NOTE — Patient Instructions (Signed)

## 2024-06-30 NOTE — Progress Notes (Addendum)
 "  Follow-Up Visit   Subjective  Makayla Burke is a 78 y.o. female who presents for the following: Mohs for Nodular basal cell carcinoma on the right ear inferior helix. Biopsy was performed on  05/21/2024 by Alm Rhyme, MD. .  The following portions of the chart were reviewed this encounter and updated as appropriate: medications, allergies, medical history  Review of Systems:  No other skin or systemic complaints except as noted in HPI or Assessment and Plan.  Pt reports lesion has been present for one year, denies pain and itchiness, and has been treated in the past using cryo.   Objective  Well appearing patient in no apparent distress; mood and affect are within normal limits.  A focused examination was performed of the following areas: Ear Relevant physical exam findings are noted in the Assessment and Plan.   Right Ear Inferior Helix Hyperkeratotic papule   Assessment & Plan   BASAL CELL CARCINOMA (BCC), UNSPECIFIED SITE Right Ear Inferior Helix - Mohs surgery  Consent obtained: written  Anticoagulation: Is the patient taking prescription anticoagulant and/or aspirin prescribed/recommended by a physician? Yes   Was the anticoagulation regimen changed prior to Mohs? No    Anesthesia: Anesthesia method: local infiltration Local anesthetic: lidocaine  1% WITH epi  Procedure Details: Timeout: pre-procedure verification complete Procedure Prep: patient was prepped and draped in usual sterile fashion Prep type: chlorhexidine Biopsy accession number: [id number] Biopsy lab: [facility] Date of biopsy: 05/21/2024 Pre-Op diagnosis: basal cell carcinoma BCC subtype: nodular MohsAIQ Surgical site (if tumor spans multiple areas, please select predominant area): ear Surgery side: right Surgical site (from skin exam): Right Ear Inferior Helix Pre-operative length (cm): 0.8 Pre-operative width (cm): 0.7 Indications for Mohs surgery: anatomic location where tissue  conservation is critical Previously treated? Yes   Previous treatment type: cryotherapy  Micrographic Surgery Details: Post-operative length (cm): 2.2 Post-operative width (cm): 0.9 Number of Mohs stages: 2 Post surgery depth of defect: skeletal muscle  Stage 1    Tumor features identified on Mohs section: basal carcinoma    Other tumor features identified: Basosquamous  Stage 2    Tumor features identified on Mohs section: no tumor identified    Depth of tumor invasion after stage: skeletal muscle  Patient tolerance of procedure: tolerated well, no immediate complications  Reconstruction: Was the defect reconstructed? Yes   Was reconstruction performed by the same Mohs surgeon? Yes   Setting of reconstruction: outpatient office When was reconstruction performed? same day Type of reconstruction: flap Type of flap: advancement   Advancement flap type: helical rim Area of flap (cm2): 3.0 x 2.8.  Opioids: Did the patient receive a prescription for opioid/narcotic related to Mohs surgery? Yes   Indications for opioid/narcotics: patient required additional pain relief despite trial of non-opioid analgesia  Antibiotics: Does patient meet AHA guidelines for endocarditis?: No   Does patient meet AHA guidelines for orthopedic prophylaxis?: No   Were antibiotics given on the day of surgery?: No   Did surgery breach mucosa, expose cartilage/bone, involve an area of lymphedema/inflamed/infected tissue? No    This Visit - mupirocin  ointment (BACTROBAN ) 2 % - Apply 1 Application topically 2 (two) times daily. - gentamicin  ointment (GARAMYCIN ) 0.1 % - Apply 1 Application topically 3 (three) times daily. - oxyCODONE  (OXY IR/ROXICODONE ) 5 MG immediate release tablet - Take 1 tablet (5 mg total) by mouth every 6 (six) hours as needed for up to 8 doses.   Return in about 10 days (around 07/10/2024) for Suture  Removal.  Makayla Rollene Gobble, RN, am acting as scribe for RUFUS CHRISTELLA HOLY, MD  .   06/30/2024  HISTORY OF PRESENT ILLNESS  Makayla Burke is seen in consultation at the request of Dr. Hester for biopsy-proven Nodular Basal Cell Carcinoma of the right inferior helix. They note that the area has been present for about 1 year increasing in size with time.  There is no history of previous treatment.  Reports no other new or changing lesions and has no other complaints today.  Medications and allergies: see patient chart.  Review of systems: Reviewed 8 systems and notable for the above skin cancer.  All other systems reviewed are unremarkable/negative, unless noted in the HPI. Past medical history, surgical history, family history, social history were also reviewed and are noted in the chart/questionnaire.    PHYSICAL EXAMINATION  General: Well-appearing, in no acute distress, alert and oriented x 4. Vitals reviewed in chart (if available).   Skin: Exam reveals a 0.8 x 0.7 cm erythematous papule and biopsy scar on the right inferior helix. There are rhytids, telangiectasias, and lentigines, consistent with photodamage.  Biopsy report(s) reviewed, confirming the diagnosis.   ASSESSMENT  1) Nodular Basal Cell Carcinoma of the right inferior helix 2) photodamage 3) solar lentigines   PLAN   1. Due to location, size, histology, or recurrence and the likelihood of subclinical extension as well as the need to conserve normal surrounding tissue, the patient was deemed acceptable for Mohs micrographic surgery (MMS).  The nature and purpose of the procedure, associated benefits and risks including recurrence and scarring, possible complications such as pain, infection, and bleeding, and alternative methods of treatment if appropriate were discussed with the patient during consent. The lesion location was verified by the patient, by reviewing previous notes, pathology reports, and by photographs as well as angulation measurements if available.  Informed consent was reviewed and  signed by the patient, and timeout was performed at 8:30 AM. See op note below.  2. For the photodamage and solar lentigines, sun protection discussed/information given on OTC sunscreens, and we recommend continued regular follow-up with primary dermatologist every 6 months or sooner for any growing, bleeding, or changing lesions. 3. Prognosis and future surveillance discussed. 4. Letter with treatment outcome sent to referring provider. 5. Pain acetaminophen /ibuprofen/oxycodone  5 mg  MOHS MICROGRAPHIC SURGERY AND RECONSTRUCTION  Initial size:   0.8 x 0.7 cm Surgical defect/wound size: 2.2 x 0.9 cm Anesthesia:    0.33% lidocaine  with 1:200,000 epinephrine  EBL:    <5 mL Complications:  None Repair type:   Adjacent Tissue Transfer (Helical Rim Advancement Flap) SQ suture:   5-0 Monocryl Cutaneous suture:  5-0 Polyprolene Final size of the repair: 3.0 x 2.8 = 8.4 cm^2  Stages: 2  STAGE I: Anesthesia achieved with 0.5% lidocaine  with 1:200,000 epinephrine . ChloraPrep applied. 1 section(s) excised using Mohs technique (this includes total peripheral and deep tissue margin excision and evaluation with frozen sections, excised and interpreted by the same physician). The tumor was first debulked and then excised with an approx. 2 mm margin.  Hemostasis was achieved with electrocautery as needed.  The specimen was then oriented, subdivided/relaxed, inked, and processed using Mohs technique.    Frozen section analysis revealed a positive margin for presence of islands of basaloid cells with peripheral palisading alongside areas of squamous differentiation with keratinization and intercellular bridges in the peripheral margin.    STAGE II: An additional 2 mm margin was excised.  Hemostasis was achieved with electrocautery  as needed.  The specimen was then oriented, subdivided/relaxed, inked, and processed using Mohs technique. Evaluation of slides by the Mohs surgeon revealed clear tumor  margins.   Reconstruction  PROCEDURE: Advancement Flap The nature of the procedure was discussed with the patient in detail, including alternatives.  The risks discussed included but not limited to potential for infection, bleeding, scar formation, and damage to underlying structures.  The patient understood the risks and signed the consent form (scanned into chart).  This wound was reconstructed with an advancement flap.  Local anesthesia was achieved with the anesthetic indicated above.  The operative site was prepped with a surgical antiseptic solution, and then draped with sterile towels to insure a sterile field.  The beveled edges of the wound were excised to 90 degrees relative to the surface skin plane.  The wound was undermined in all directions, and meticulous hemostasis was achieved with an electrosurgical device.  Relaxing incisions were made, if necessary, for lateral tension release.  The tissue was advanced and closed centrally, and redundant tissue was trimmed as necessary.  The wound was sutured in a layered fashion to close potential dead space and to precisely and securely approximate the wound edges.  The dimensions of the flap were:  3.0 cm x 2.8 cm for a total flap surface area of 8.4 centimeters squared (cm2).    The wound was covered with petrolatum and a dressing.  The patient understands the need to return immediately for any signs of infection to include swelling, pain, purulent discharge, localized warmth, or fever.  Contact information was provided to the patient (including after-hours pager numbers)       Documentation: I have reviewed the above documentation for accuracy and completeness, and I agree with the above.  RUFUS CHRISTELLA HOLY, MD   "

## 2024-07-01 ENCOUNTER — Other Ambulatory Visit: Payer: Self-pay

## 2024-07-01 ENCOUNTER — Other Ambulatory Visit (HOSPITAL_COMMUNITY): Payer: Self-pay

## 2024-07-10 ENCOUNTER — Ambulatory Visit (INDEPENDENT_AMBULATORY_CARE_PROVIDER_SITE_OTHER): Admitting: Dermatology

## 2024-07-10 ENCOUNTER — Encounter: Payer: Self-pay | Admitting: Dermatology

## 2024-07-10 DIAGNOSIS — L905 Scar conditions and fibrosis of skin: Secondary | ICD-10-CM

## 2024-07-10 DIAGNOSIS — C44212 Basal cell carcinoma of skin of right ear and external auricular canal: Secondary | ICD-10-CM

## 2024-07-10 DIAGNOSIS — Z85828 Personal history of other malignant neoplasm of skin: Secondary | ICD-10-CM

## 2024-07-10 DIAGNOSIS — T1490XD Injury, unspecified, subsequent encounter: Secondary | ICD-10-CM

## 2024-07-10 DIAGNOSIS — L539 Erythematous condition, unspecified: Secondary | ICD-10-CM

## 2024-07-10 NOTE — Progress Notes (Signed)
" ° °  Follow Up Visit   Subjective  Makayla Burke is a 79 y.o. female who presents for the following: follow up from Mohs surgery   The patient presents for follow up from Mohs surgery for a Nodular basal cell carcinoma on the right ear inferior helix, treated on 06/30/24, repaired with helical rim advancement flap. The patient has been bandaging the wound as directed. The endorse the following concerns: none  The following portions of the chart were reviewed this encounter and updated as appropriate: medications, allergies, medical history  Review of Systems:  No other skin or systemic complaints except as noted in HPI or Assessment and Plan.  Objective  Well appearing patient in no apparent distress; mood and affect are within normal limits.  A focal examination was performed including  face and ears All findings within normal limits unless otherwise noted below.  Healing wound with mild erythema  Relevant physical exam findings are noted in the Assessment and Plan.       Assessment & Plan    Healing Wound s/p Mohs for Nodular basal cell carcinoma on the right ear inferior helix , treated on 06/30/24, repaired with helical rim advancement flap - Reassured that wound is healing well - Sutures removed - No evidence of infection - No swelling, induration, purulence, dehiscence, or tenderness out of proportion to the clinical exam, see photo above - Discussed that scars take up to 12 months to mature from the date of surgery - Recommend SPF 30+ to scar daily to prevent purple color from UV exposure during scar maturation process - Discussed that erythema and raised appearance of scar will fade over the next 4-6 months - OK to start scar massage at 4-6 weeks post-op - Can consider silicone based products for scar healing starting at 6 weeks post-op - Ok to continue ointment daily to wound under a bandage for another week  HISTORY OF BASAL CELL CARCINOMA OF THE SKIN - No evidence of  recurrence today - Recommend regular full body skin exams - Recommend daily broad spectrum sunscreen SPF 30+ to sun-exposed areas, reapply every 2 hours as needed.  - Call if any new or changing lesions are noted between office visits    Return in about 4 weeks (around 08/07/2024) for wound check.  I, Doyce Pan, CMA, am acting as scribe for RUFUS CHRISTELLA HOLY, MD.   Documentation: I have reviewed the above documentation for accuracy and completeness, and I agree with the above.  RUFUS CHRISTELLA HOLY, MD  "

## 2024-07-10 NOTE — Patient Instructions (Signed)

## 2024-08-06 ENCOUNTER — Ambulatory Visit: Admitting: Dermatology

## 2024-08-10 ENCOUNTER — Ambulatory Visit: Admitting: Dermatology

## 2024-08-25 ENCOUNTER — Ambulatory Visit: Admitting: Dermatology

## 2024-09-02 ENCOUNTER — Ambulatory Visit: Admitting: Dermatology

## 2024-09-03 ENCOUNTER — Ambulatory Visit: Admitting: Dermatology

## 2024-11-18 ENCOUNTER — Ambulatory Visit: Admitting: Dermatology
